# Patient Record
Sex: Male | Born: 1950 | Race: White | Hispanic: No | Marital: Single | Smoking: Former smoker
Health system: Southern US, Community
[De-identification: ages and names within clinical notes are randomized; demographics above are authoritative.]

## PROBLEM LIST (undated history)

## (undated) DIAGNOSIS — N5089 Other specified disorders of the male genital organs: Secondary | ICD-10-CM

## (undated) DIAGNOSIS — J449 Chronic obstructive pulmonary disease, unspecified: Secondary | ICD-10-CM

## (undated) DIAGNOSIS — K219 Gastro-esophageal reflux disease without esophagitis: Secondary | ICD-10-CM

## (undated) DIAGNOSIS — E785 Hyperlipidemia, unspecified: Secondary | ICD-10-CM

## (undated) DIAGNOSIS — I10 Essential (primary) hypertension: Secondary | ICD-10-CM

## (undated) HISTORY — PX: APPENDECTOMY: SHX54

---

## 2012-10-03 DIAGNOSIS — M6208 Separation of muscle (nontraumatic), other site: Secondary | ICD-10-CM

## 2012-10-03 HISTORY — DX: Separation of muscle (nontraumatic), other site: M62.08

## 2013-05-09 DIAGNOSIS — M6208 Separation of muscle (nontraumatic), other site: Secondary | ICD-10-CM | POA: Insufficient documentation

## 2014-10-03 HISTORY — PX: HERNIA REPAIR: SHX51

## 2016-02-03 DIAGNOSIS — J449 Chronic obstructive pulmonary disease, unspecified: Secondary | ICD-10-CM | POA: Insufficient documentation

## 2016-02-03 DIAGNOSIS — R0602 Shortness of breath: Secondary | ICD-10-CM | POA: Insufficient documentation

## 2016-02-03 DIAGNOSIS — E785 Hyperlipidemia, unspecified: Secondary | ICD-10-CM | POA: Insufficient documentation

## 2016-02-03 DIAGNOSIS — Z8249 Family history of ischemic heart disease and other diseases of the circulatory system: Secondary | ICD-10-CM | POA: Insufficient documentation

## 2016-02-03 DIAGNOSIS — I10 Essential (primary) hypertension: Secondary | ICD-10-CM | POA: Insufficient documentation

## 2016-10-03 DIAGNOSIS — N400 Enlarged prostate without lower urinary tract symptoms: Secondary | ICD-10-CM

## 2016-10-03 HISTORY — DX: Benign prostatic hyperplasia without lower urinary tract symptoms: N40.0

## 2017-08-14 DIAGNOSIS — K219 Gastro-esophageal reflux disease without esophagitis: Secondary | ICD-10-CM | POA: Insufficient documentation

## 2017-08-14 DIAGNOSIS — N401 Enlarged prostate with lower urinary tract symptoms: Secondary | ICD-10-CM | POA: Insufficient documentation

## 2017-09-22 DIAGNOSIS — N5089 Other specified disorders of the male genital organs: Secondary | ICD-10-CM | POA: Insufficient documentation

## 2017-12-06 DIAGNOSIS — K2920 Alcoholic gastritis without bleeding: Secondary | ICD-10-CM | POA: Insufficient documentation

## 2018-05-15 DIAGNOSIS — B351 Tinea unguium: Secondary | ICD-10-CM | POA: Insufficient documentation

## 2018-08-10 ENCOUNTER — Ambulatory Visit
Admission: RE | Admit: 2018-08-10 | Discharge: 2018-08-10 | Disposition: A | Discharge status: Home / Self Care | Payer: Medicare Other | Source: Ambulatory Visit | Attending: Internal Medicine | Admitting: Internal Medicine

## 2018-08-10 ENCOUNTER — Other Ambulatory Visit: Payer: Self-pay | Admitting: Internal Medicine

## 2018-08-10 ENCOUNTER — Encounter (INDEPENDENT_AMBULATORY_CARE_PROVIDER_SITE_OTHER): Payer: Self-pay

## 2018-08-10 DIAGNOSIS — M25475 Effusion, left foot: Secondary | ICD-10-CM | POA: Diagnosis present

## 2019-10-14 ENCOUNTER — Other Ambulatory Visit: Payer: Self-pay

## 2019-10-14 ENCOUNTER — Encounter: Payer: Self-pay | Admitting: Podiatry

## 2019-10-14 ENCOUNTER — Other Ambulatory Visit: Payer: Self-pay | Admitting: Podiatry

## 2019-10-14 ENCOUNTER — Ambulatory Visit (INDEPENDENT_AMBULATORY_CARE_PROVIDER_SITE_OTHER): Payer: Medicare Other | Admitting: Podiatry

## 2019-10-14 ENCOUNTER — Ambulatory Visit (INDEPENDENT_AMBULATORY_CARE_PROVIDER_SITE_OTHER): Payer: Medicare Other

## 2019-10-14 DIAGNOSIS — G5762 Lesion of plantar nerve, left lower limb: Secondary | ICD-10-CM

## 2019-10-14 DIAGNOSIS — G5782 Other specified mononeuropathies of left lower limb: Secondary | ICD-10-CM

## 2019-10-14 DIAGNOSIS — M722 Plantar fascial fibromatosis: Secondary | ICD-10-CM

## 2019-10-14 DIAGNOSIS — M79672 Pain in left foot: Secondary | ICD-10-CM | POA: Diagnosis not present

## 2019-10-14 NOTE — Progress Notes (Signed)
Subjective:  Patient ID: Lucas Schaefer, male    DOB: [birth year],  MRN: [id number]  Chief Complaint  Patient presents with  . Foot Pain    Patient presents today for painful knot and toes spreading x years, progressivly getting worse.  He states  "it feels like Im walking on a know under my 3rd toe and it hurts"    69 y.o. male presents with the above complaint.  Patient states that he has pain to the left submetatarsal 3.  He states it hurts when ambulating.  He states that there is a knot underneath the third and fourth digit.  He denies any other acute complaints.  He ambulates with regular sneakers.  He states this has been going on for couple of years.  It is progressively getting worse.  He states that he was getting cortisone injection by a previous doctor in [facility].  He denies any other acute complaints at this time   Review of Systems: Negative except as noted in the HPI. Denies N/V/F/Ch.  No past medical history on file.  Current Outpatient Medications:  .  budesonide-formoterol (SYMBICORT) 160-4.5 MCG/ACT inhaler, Inhale into the lungs., Disp: , Rfl:  .  nicotine (NICOTROL) 10 MG inhaler, Inhale into the lungs., Disp: , Rfl:  .  Buprenorphine HCl-Naloxone HCl 8-2 MG FILM, Place under the tongue., Disp: , Rfl:  .  CHANTIX CONTINUING MONTH PAK 1 MG tablet, Take 1 mg by mouth 2 (two) times daily., Disp: , Rfl:  .  lisinopril-hydrochlorothiazide (ZESTORETIC) 20-12.5 MG tablet, Take 1 tablet by mouth daily., Disp: , Rfl:  .  omeprazole (PRILOSEC) 40 MG capsule, Take 40 mg by mouth daily., Disp: , Rfl:  .  pravastatin (PRAVACHOL) 40 MG tablet, Take 40 mg by mouth daily., Disp: , Rfl:   Social History   Tobacco Use  Smoking Status Current Some Day Smoker  . Types: Cigars  Smokeless Tobacco Never Used    No Known Allergies Objective:  There were no vitals filed for this visit. There is no height or weight on file to calculate BMI. Constitutional Well  developed. Well nourished.  Vascular Dorsalis pedis pulses palpable bilaterally. Posterior tibial pulses palpable bilaterally. Capillary refill normal to all digits.  No cyanosis or clubbing noted. Pedal hair growth normal.  Neurologic Normal speech. Oriented to person, place, and time. Epicritic sensation to light touch grossly present bilaterally.  Dermatologic Nails well groomed and normal in appearance. No open wounds. No skin lesions.  Orthopedic:  Pain on palpation to the third interspace of the left foot.  Positive Mulder's click positive Tinel's sign.  No pain with range of motion of the second and third metatarsophalangeal joint.  No intra-articular pain of the third and fourth metatarsophalangeal joint.  Positive Conley Canal sign of the third and fourth digit   Radiographs: 3 views of skeletally mature adult foot left: Positive Conley Canal sign noted of the third and fourth digit.  No other bony abnormalities noted.  No arthritic changes noted. Assessment:   1. 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 Left foot pain    Plan:  Patient was evaluated and treated and all questions answered.  Left third interspace neuroma/Morton's neuroma -I explained to the patient the etiology of neuroma as well as various treatment options associated with it.  I explained to the patient that there is 2 ways to treat a neuroma either with 4% alcohol sclerosing injection versus surgical excision of the neuroma.  Patient elected  to undergo 4% alcohol sclerosing injection at this time.  He states that if the injection does not work then we will consider surgical excision at that time.  I explained to the patient that this will take about 6 to 7 injections approximately 3 weeks apart.  Patient states understanding and would like to proceed with the injection -After obtaining consent, and per orders of Dr. Boneta Lucks, injection of 4% dehydrated alcohol injections in the third interspace left foot given by  Felipa Furnace. Patient instructed to remain in clinic for 20 minutes afterwards, and to report any adverse reaction to me immediately.   No follow-ups on file.

## 2019-10-17 IMAGING — CR DG FOOT 2V*L*
2 series · 2 of 2 positions shown · non-contrast
Comparison: None.

CLINICAL DATA: 66 y/o  M; swelling of the foot joint.

EXAM:
LEFT FOOT - 2 VIEW

[foot ap]
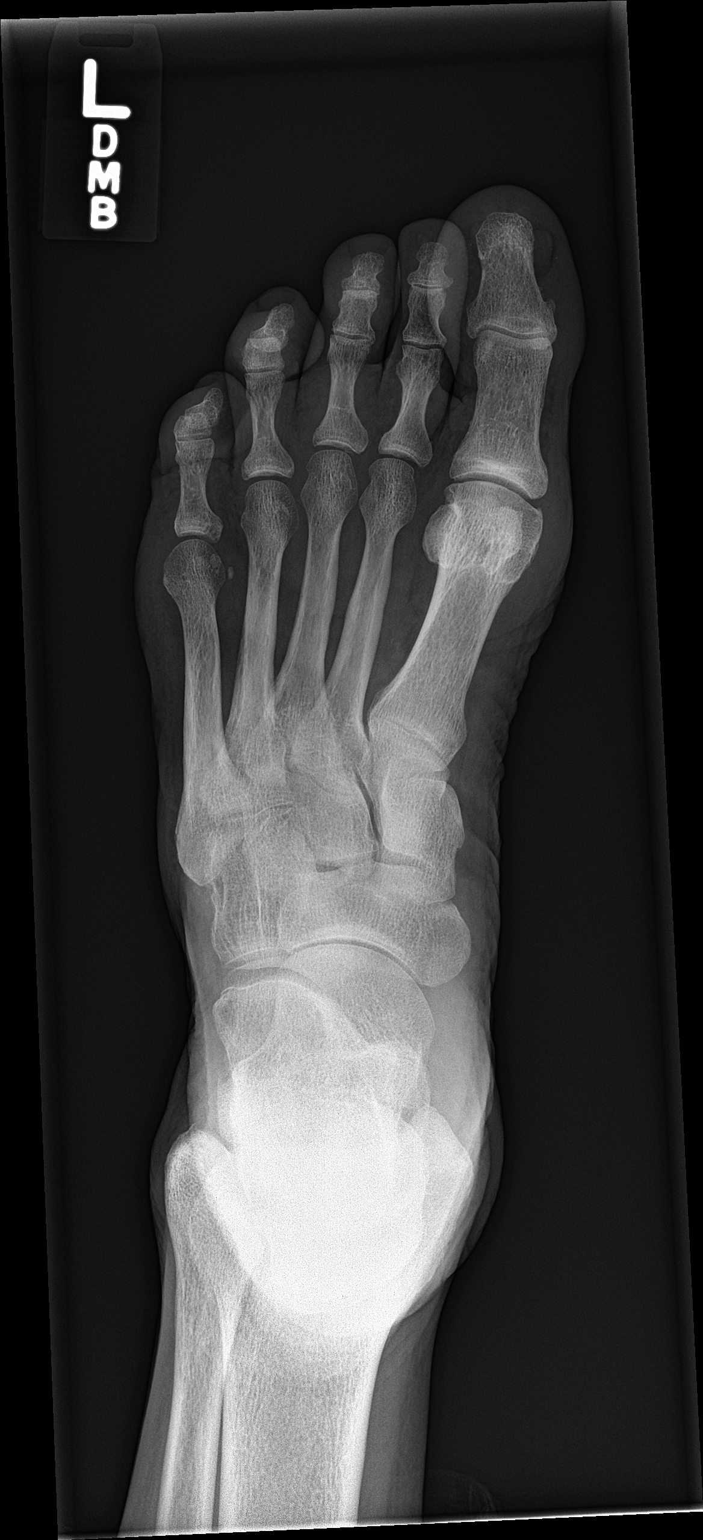

[foot lat]
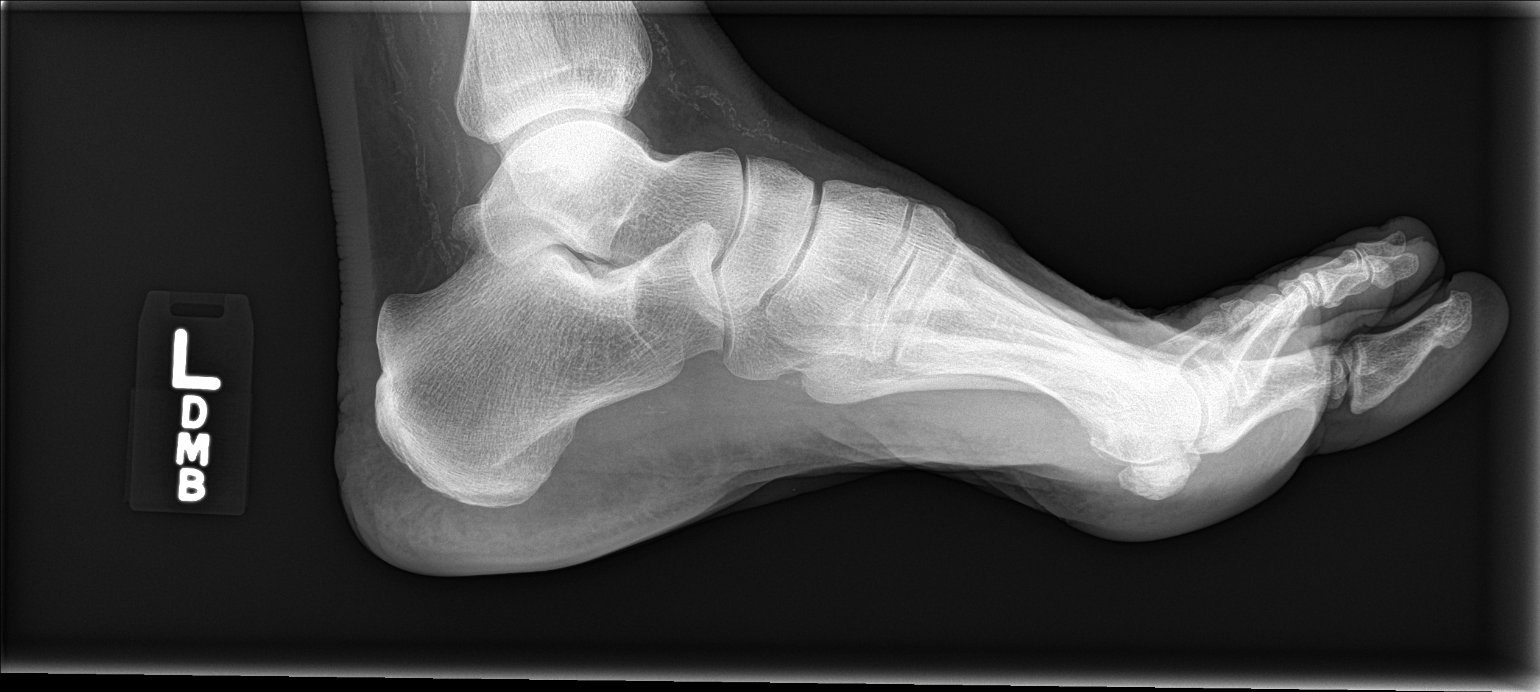

[2 of 2 positions shown; findings below may reference images not displayed]

FINDINGS: There is no evidence of fracture or dislocation. There is no
evidence of arthropathy or other focal bone abnormality. Vascular
calcifications.
IMPRESSION: Negative.

## 2019-11-05 ENCOUNTER — Other Ambulatory Visit: Payer: Self-pay

## 2019-11-05 ENCOUNTER — Ambulatory Visit (INDEPENDENT_AMBULATORY_CARE_PROVIDER_SITE_OTHER): Payer: Medicare Other | Admitting: Podiatry

## 2019-11-05 ENCOUNTER — Encounter: Payer: Self-pay | Admitting: Podiatry

## 2019-11-05 DIAGNOSIS — M79672 Pain in left foot: Secondary | ICD-10-CM | POA: Diagnosis not present

## 2019-11-05 DIAGNOSIS — G5762 Lesion of plantar nerve, left lower limb: Secondary | ICD-10-CM | POA: Diagnosis not present

## 2019-11-05 DIAGNOSIS — G5782 Other specified mononeuropathies of left lower limb: Secondary | ICD-10-CM

## 2019-11-05 NOTE — Progress Notes (Signed)
Subjective:  Patient ID: Lucas Schaefer, male    DOB: Feb 11, 1951,  MRN: [id number]  Chief Complaint  Patient presents with  . Foot Pain    pt is here for a f/u of neuroma of the left foot, pt states that he is feeling alot better since the last time he was here, pt also states that the left foot does have pain from time to time.    69 y.o. male presents with the above complaint.  Patient is here for follow-up of left foot neuroma third interspace.  Patient states the injection that was given last time is already helping a lot.  He states is doing much better.  He is here for his multiple series of neuroma injection with sclerosing alcohol.  His pain is 5 out of 10.  He denies any other acute complaints.  Today will be his second injection   Review of Systems: Negative except as noted in the HPI. Denies N/V/F/Ch.  No past medical history on file.  Current Outpatient Medications:  .  budesonide-formoterol (SYMBICORT) 160-4.5 MCG/ACT inhaler, Inhale into the lungs., Disp: , Rfl:  .  Buprenorphine HCl-Naloxone HCl 8-2 MG FILM, Place under the tongue., Disp: , Rfl:  .  CHANTIX CONTINUING MONTH PAK 1 MG tablet, Take 1 mg by mouth 2 (two) times daily., Disp: , Rfl:  .  lisinopril-hydrochlorothiazide (ZESTORETIC) 20-12.5 MG tablet, Take 1 tablet by mouth daily., Disp: , Rfl:  .  nicotine (NICOTROL) 10 MG inhaler, Inhale into the lungs., Disp: , Rfl:  .  omeprazole (PRILOSEC) 40 MG capsule, Take 40 mg by mouth daily., Disp: , Rfl:  .  pravastatin (PRAVACHOL) 40 MG tablet, Take 40 mg by mouth daily., Disp: , Rfl:  .  PREVIDENT 5000 SENSITIVE 1.1-5 % PSTE, Place 1 application onto teeth 2 (two) times daily., Disp: , Rfl:   Social History   Tobacco Use  Smoking Status Current Some Day Smoker  . Types: Cigars  Smokeless Tobacco Never Used    No Known Allergies Objective:  There were no vitals filed for this visit. There is no height or weight on file to calculate BMI.  Constitutional Well developed. Well nourished.  Vascular Dorsalis pedis pulses palpable bilaterally. Posterior tibial pulses palpable bilaterally. Capillary refill normal to all digits.  No cyanosis or clubbing noted. Pedal hair growth normal.  Neurologic Normal speech. Oriented to person, place, and time. Epicritic sensation to light touch grossly present bilaterally.  Dermatologic Nails well groomed and normal in appearance. No open wounds. No skin lesions.  Orthopedic:  Pain on palpation to the third interspace of the left foot.  Positive Mulder's click positive Tinel's sign.  No pain with range of motion of the second and third metatarsophalangeal joint.  No intra-articular pain of the third and fourth metatarsophalangeal joint.  Positive Conley Canal sign of the third and fourth digit   Radiographs: None Assessment:   1. Left foot pain   2. 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  Left third interspace neuroma/Morton's neuroma -Patient is here for his multiple series of sclerosing 4% alcohol injection to the left third interspace for treatment of Morton's neuroma.  Today will be his second injection as described below. -After obtaining consent, and per orders of Dr. Boneta Lucks, injection of 4% dehydrated alcohol injections in the third interspace left foot given by Felipa Furnace. Patient instructed to remain in clinic for 20 minutes  afterwards, and to report any adverse reaction to me immediately.   No follow-ups on file.

## 2019-11-26 ENCOUNTER — Ambulatory Visit: Payer: Medicare Other | Admitting: Podiatry

## 2019-12-03 ENCOUNTER — Other Ambulatory Visit: Payer: Self-pay

## 2019-12-03 ENCOUNTER — Ambulatory Visit (INDEPENDENT_AMBULATORY_CARE_PROVIDER_SITE_OTHER): Payer: Medicare Other | Admitting: Podiatry

## 2019-12-03 DIAGNOSIS — M79672 Pain in left foot: Secondary | ICD-10-CM

## 2019-12-03 DIAGNOSIS — G5762 Lesion of plantar nerve, left lower limb: Secondary | ICD-10-CM

## 2019-12-03 DIAGNOSIS — G5782 Other specified mononeuropathies of left lower limb: Secondary | ICD-10-CM

## 2019-12-04 ENCOUNTER — Encounter: Payer: Self-pay | Admitting: Podiatry

## 2019-12-04 NOTE — Progress Notes (Signed)
Subjective:  Patient ID: Lucas Schaefer, male    DOB: 1951/08/25,  MRN: [id number]  Chief Complaint  Patient presents with  . Neuroma    pt is here for a possible 3rd injection of neuroma, pt is feeling a lot better since the last time he was here, pt states that his pain has gone down to a 5 out of 10 on the pain scale.    69 y.o. male presents with the above complaint.  Patient is here for follow-up of left foot neuroma third interspace.  Patient states the injection that was given last time is already helping a lot.  He states is doing much better.  He is here for his multiple series of neuroma injection with sclerosing alcohol.  His pain is 4 out of 10.  He denies any other acute complaints.  Today will be third injection.   Review of Systems: Negative except as noted in the HPI. Denies N/V/F/Ch.  No past medical history on file.  Current Outpatient Medications:  .  albuterol (VENTOLIN HFA) 108 (90 Base) MCG/ACT inhaler, , Disp: , Rfl:  .  amLODipine (NORVASC) 10 MG tablet, Take by mouth., Disp: , Rfl:  .  budesonide-formoterol (SYMBICORT) 160-4.5 MCG/ACT inhaler, Inhale into the lungs., Disp: , Rfl:  .  Buprenorphine HCl-Naloxone HCl 8-2 MG FILM, Place under the tongue., Disp: , Rfl:  .  CHANTIX CONTINUING MONTH PAK 1 MG tablet, Take 1 mg by mouth 2 (two) times daily., Disp: , Rfl:  .  lisinopril-hydrochlorothiazide (ZESTORETIC) 20-12.5 MG tablet, Take 1 tablet by mouth daily., Disp: , Rfl:  .  nicotine (NICOTROL) 10 MG inhaler, Inhale into the lungs., Disp: , Rfl:  .  omeprazole (PRILOSEC) 40 MG capsule, Take 40 mg by mouth daily., Disp: , Rfl:  .  pravastatin (PRAVACHOL) 40 MG tablet, Take 40 mg by mouth daily., Disp: , Rfl:  .  PREVIDENT 5000 SENSITIVE 1.1-5 % PSTE, Place 1 application onto teeth 2 (two) times daily., Disp: , Rfl:  .  tamsulosin (FLOMAX) 0.4 MG CAPS capsule, Take by mouth., Disp: , Rfl:   Social History   Tobacco Use  Smoking Status Current Some Day  Smoker  . Types: Cigars  Smokeless Tobacco Never Used    No Known Allergies Objective:  There were no vitals filed for this visit. There is no height or weight on file to calculate BMI. Constitutional Well developed. Well nourished.  Vascular Dorsalis pedis pulses palpable bilaterally. Posterior tibial pulses palpable bilaterally. Capillary refill normal to all digits.  No cyanosis or clubbing noted. Pedal hair growth normal.  Neurologic Normal speech. Oriented to person, place, and time. Epicritic sensation to light touch grossly present bilaterally.  Dermatologic Nails well groomed and normal in appearance. No open wounds. No skin lesions.  Orthopedic:  Mild pain on palpation to the third interspace of the left foot.  Positive Mulder's click positive Tinel's sign.  No pain with range of motion of the second and third metatarsophalangeal joint.  No intra-articular pain of the third and fourth metatarsophalangeal joint.  Positive Conley Canal sign of the third and fourth digit   Radiographs: None Assessment:   1. Left foot pain   2. 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  Left third interspace neuroma/Morton's neuroma -Patient is here for his multiple series of sclerosing 4% alcohol injection to the left third interspace for treatment of Morton's neuroma.  Today will  be his third injection as described below. -After obtaining consent, and per orders of Dr. Boneta Lucks, injection of 4% dehydrated alcohol injections in the third interspace left foot given by Felipa Furnace. Patient instructed to remain in clinic for 20 minutes afterwards, and to report any adverse reaction to me immediately.   No follow-ups on file.

## 2019-12-24 ENCOUNTER — Ambulatory Visit (INDEPENDENT_AMBULATORY_CARE_PROVIDER_SITE_OTHER): Payer: Medicare Other | Admitting: Podiatry

## 2019-12-24 ENCOUNTER — Encounter: Payer: Self-pay | Admitting: Podiatry

## 2019-12-24 ENCOUNTER — Other Ambulatory Visit: Payer: Self-pay

## 2019-12-24 DIAGNOSIS — G5782 Other specified mononeuropathies of left lower limb: Secondary | ICD-10-CM

## 2019-12-24 DIAGNOSIS — T148XXA Other injury of unspecified body region, initial encounter: Secondary | ICD-10-CM | POA: Diagnosis not present

## 2019-12-24 DIAGNOSIS — G5762 Lesion of plantar nerve, left lower limb: Secondary | ICD-10-CM | POA: Diagnosis not present

## 2019-12-24 DIAGNOSIS — M79672 Pain in left foot: Secondary | ICD-10-CM

## 2019-12-24 NOTE — Progress Notes (Signed)
Subjective:  Patient ID: Lucas Schaefer, male    DOB: 18-Feb-1951,  MRN: [id number]  Chief Complaint  Patient presents with  . Neuroma    pt is here for a possible neuroma injection, pt also states that the left foot pain will have a popping sensation at times, pt puts pain as a 5 out of 16    69 y.o. male presents with the above complaint.  Patient is here for follow-up of left foot neuroma third interspace.  Patient states the injection that was given last time is already helping a lot.  He states is doing much better.  He is here for his multiple series of neuroma injection with sclerosing alcohol.  His pain is 4 out of 10.  He denies any other acute complaints.  Today will be fourth injection.  Patient also has a secondary complaint of left leg soft tissue contusion.  Patient states he tripped and fell and bruised the left leg.  It appears that patient has abrasion with some swelling associated with it.  There is mild ecchymosis present.  But he has been able to ambulate and does not have any pain.  He denies any other acute complaints.   Review of Systems: Negative except as noted in the HPI. Denies N/V/F/Ch.  No past medical history on file.  Current Outpatient Medications:  .  albuterol (VENTOLIN HFA) 108 (90 Base) MCG/ACT inhaler, , Disp: , Rfl:  .  amLODipine (NORVASC) 10 MG tablet, Take by mouth., Disp: , Rfl:  .  budesonide-formoterol (SYMBICORT) 160-4.5 MCG/ACT inhaler, Inhale into the lungs., Disp: , Rfl:  .  Buprenorphine HCl-Naloxone HCl 8-2 MG FILM, Place under the tongue., Disp: , Rfl:  .  CHANTIX CONTINUING MONTH PAK 1 MG tablet, Take 1 mg by mouth 2 (two) times daily., Disp: , Rfl:  .  lisinopril-hydrochlorothiazide (ZESTORETIC) 20-12.5 MG tablet, Take 1 tablet by mouth daily., Disp: , Rfl:  .  nicotine (NICOTROL) 10 MG inhaler, Inhale into the lungs., Disp: , Rfl:  .  omeprazole (PRILOSEC) 40 MG capsule, Take 40 mg by mouth daily., Disp: , Rfl:  .  pravastatin  (PRAVACHOL) 40 MG tablet, Take 40 mg by mouth daily., Disp: , Rfl:  .  PREVIDENT 5000 SENSITIVE 1.1-5 % PSTE, Place 1 application onto teeth 2 (two) times daily., Disp: , Rfl:  .  tamsulosin (FLOMAX) 0.4 MG CAPS capsule, Take by mouth., Disp: , Rfl:   Social History   Tobacco Use  Smoking Status Current Some Day Smoker  . Types: Cigars  Smokeless Tobacco Never Used    No Known Allergies Objective:  There were no vitals filed for this visit. There is no height or weight on file to calculate BMI. Constitutional Well developed. Well nourished.  Vascular Dorsalis pedis pulses palpable bilaterally. Posterior tibial pulses palpable bilaterally. Capillary refill normal to all digits.  No cyanosis or clubbing noted. Pedal hair growth normal.  Neurologic Normal speech. Oriented to person, place, and time. Epicritic sensation to light touch grossly present bilaterally.  Dermatologic Nails well groomed and normal in appearance. No open wounds. No skin lesions.  Orthopedic:  Mild pain on palpation to the third interspace of the left foot.  Positive Mulder's click positive Tinel's sign.  No pain with range of motion of the second and third metatarsophalangeal joint.  No intra-articular pain of the third and fourth metatarsophalangeal joint.  Positive Conley Canal sign of the third and fourth digit  Left leg contusion with swelling and mild abrasion.  No pain on palpation.  No concern for bony involvement/fractures   Radiographs: None Assessment:   1. Contusion of soft tissue   2. 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 Left foot pain    Plan:  Patient was evaluated and treated and all questions answered.  Left third interspace neuroma/Morton's neuroma -Patient is here for his multiple series of sclerosing 4% alcohol injection to the left third interspace for treatment of Morton's neuroma.  Today will be his fourth injection as described below. -After obtaining consent, and per  orders of Dr. Boneta Lucks, injection of 4% dehydrated alcohol injections in the third interspace left foot given by Felipa Furnace. Patient instructed to remain in clinic for 20 minutes afterwards, and to report any adverse reaction to me immediately.  Left leg soft tissue contusion with superficial abrasion -I explained to the patient the etiology of soft tissue contusion and various treatment options were discussed.  After his acute fall he may have been because some muscle bruising leading to mild ecchymosis and swelling associated with it.  Given that this is only 39 days old.  I have asked the patient to aggressively elevate.  Given that he does not have any pain I am less concerned about osseous involvement. -Triple Antibiotic and a Band-Aid was applied.   No follow-ups on file.

## 2020-01-14 ENCOUNTER — Ambulatory Visit: Payer: Medicare Other | Admitting: Podiatry

## 2020-01-28 ENCOUNTER — Encounter: Payer: Self-pay | Admitting: Podiatry

## 2020-01-28 ENCOUNTER — Ambulatory Visit (INDEPENDENT_AMBULATORY_CARE_PROVIDER_SITE_OTHER): Payer: Medicare Other | Admitting: Podiatry

## 2020-01-28 ENCOUNTER — Other Ambulatory Visit: Payer: Self-pay

## 2020-01-28 DIAGNOSIS — G5782 Other specified mononeuropathies of left lower limb: Secondary | ICD-10-CM

## 2020-01-28 DIAGNOSIS — M79672 Pain in left foot: Secondary | ICD-10-CM

## 2020-01-28 DIAGNOSIS — G5762 Lesion of plantar nerve, left lower limb: Secondary | ICD-10-CM

## 2020-01-28 NOTE — Progress Notes (Signed)
Subjective:  Patient ID: Lucas Schaefer, male    DOB: 1951-01-05,  MRN: [id number]  Chief Complaint  Patient presents with  . Foot Pain    pt is here for a f/u for neuroma of the left foot, pt states that he is feeling better, but states that he still has numbness in toes 1-5    69 y.o. male presents with the above complaint.  Patient is here following up for the left foot neuroma third interspace injection.  Patient is today here for his fifth injection with alcohol 4% sclerosing.  Patient states his pain is improved considerably.  Overall he is doing really well.  His left leg contusion has also helped healed completely.  He denies any other acute complaints.   Review of Systems: Negative except as noted in the HPI. Denies N/V/F/Ch.  No past medical history on file.  Current Outpatient Medications:  .  albuterol (VENTOLIN HFA) 108 (90 Base) MCG/ACT inhaler, , Disp: , Rfl:  .  amLODipine (NORVASC) 10 MG tablet, Take by mouth., Disp: , Rfl:  .  budesonide-formoterol (SYMBICORT) 160-4.5 MCG/ACT inhaler, Inhale into the lungs., Disp: , Rfl:  .  Buprenorphine HCl-Naloxone HCl 8-2 MG FILM, Place under the tongue., Disp: , Rfl:  .  CHANTIX CONTINUING MONTH PAK 1 MG tablet, Take 1 mg by mouth 2 (two) times daily., Disp: , Rfl:  .  lisinopril-hydrochlorothiazide (ZESTORETIC) 20-12.5 MG tablet, Take 1 tablet by mouth daily., Disp: , Rfl:  .  nicotine (NICOTROL) 10 MG inhaler, Inhale into the lungs., Disp: , Rfl:  .  omeprazole (PRILOSEC) 40 MG capsule, Take 40 mg by mouth daily., Disp: , Rfl:  .  pravastatin (PRAVACHOL) 40 MG tablet, Take 40 mg by mouth daily., Disp: , Rfl:  .  PREVIDENT 5000 SENSITIVE 1.1-5 % PSTE, Place 1 application onto teeth 2 (two) times daily., Disp: , Rfl:  .  tamsulosin (FLOMAX) 0.4 MG CAPS capsule, Take by mouth., Disp: , Rfl:   Social History   Tobacco Use  Smoking Status Current Some Day Smoker  . Types: Cigars  Smokeless Tobacco Never Used    No  Known Allergies Objective:  There were no vitals filed for this visit. There is no height or weight on file to calculate BMI. Constitutional Well developed. Well nourished.  Vascular Dorsalis pedis pulses palpable bilaterally. Posterior tibial pulses palpable bilaterally. Capillary refill normal to all digits.  No cyanosis or clubbing noted. Pedal hair growth normal.  Neurologic Normal speech. Oriented to person, place, and time. Epicritic sensation to light touch grossly present bilaterally.  Dermatologic Nails well groomed and normal in appearance. No open wounds. No skin lesions.  Orthopedic:  Mild pain on palpation to the third interspace of the left foot.  Positive Mulder's click positive Tinel's sign.  No pain with range of motion of the second and third metatarsophalangeal joint.  No intra-articular pain of the third and fourth metatarsophalangeal joint.  Positive Conley Canal sign of the third and fourth digit  Left leg contusion with swelling and mild abrasion.  No pain on palpation.  No concern for bony involvement/fractures   Radiographs: None Assessment:   1. 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 Left foot pain    Plan:  Patient was evaluated and treated and all questions answered.  Left third interspace neuroma/Morton's neuroma -Patient is here for his multiple series of sclerosing 4% alcohol injection to the left third interspace for treatment of Morton's neuroma.  Today will be  his fifth injection as described below. -After obtaining consent, and per orders of Dr. Boneta Lucks, injection of 4% dehydrated alcohol injections in the third interspace left foot given by Felipa Furnace. Patient instructed to remain in clinic for 20 minutes afterwards, and to report any adverse reaction to me immediately.  Left leg soft tissue contusion with superficial abrasion -Resolved.  No follow-ups on file.

## 2020-02-04 ENCOUNTER — Ambulatory Visit: Payer: Medicare Other | Admitting: Podiatry

## 2020-02-25 ENCOUNTER — Ambulatory Visit: Payer: Medicare Other | Admitting: Podiatry

## 2020-05-06 ENCOUNTER — Other Ambulatory Visit: Payer: Self-pay

## 2020-05-06 ENCOUNTER — Other Ambulatory Visit
Admission: RE | Admit: 2020-05-06 | Discharge: 2020-05-06 | Disposition: A | Discharge status: Home / Self Care | Payer: Medicare Other | Source: Ambulatory Visit | Attending: Gastroenterology | Admitting: Gastroenterology

## 2020-05-06 DIAGNOSIS — Z20822 Contact with and (suspected) exposure to covid-19: Secondary | ICD-10-CM | POA: Insufficient documentation

## 2020-05-06 DIAGNOSIS — Z01812 Encounter for preprocedural laboratory examination: Secondary | ICD-10-CM | POA: Insufficient documentation

## 2020-05-06 LAB — SARS CORONAVIRUS 2 (TAT 6-24 HRS): SARS Coronavirus 2: NEGATIVE

## 2020-05-07 ENCOUNTER — Encounter: Payer: Self-pay | Admitting: Internal Medicine

## 2020-05-08 ENCOUNTER — Encounter: Payer: Self-pay | Admitting: Internal Medicine

## 2020-05-08 ENCOUNTER — Other Ambulatory Visit: Payer: Self-pay

## 2020-05-08 ENCOUNTER — Ambulatory Visit
Admission: RE | Admit: 2020-05-08 | Discharge: 2020-05-08 | Disposition: A | Discharge status: Home / Self Care | Payer: Medicare Other | Attending: Gastroenterology | Admitting: Gastroenterology

## 2020-05-08 ENCOUNTER — Encounter
Admission: RE | Disposition: A | Discharge status: Home / Self Care | Payer: Self-pay | Source: Home / Self Care | Attending: Gastroenterology

## 2020-05-08 ENCOUNTER — Ambulatory Visit: Payer: Medicare Other | Admitting: Anesthesiology

## 2020-05-08 DIAGNOSIS — Z79899 Other long term (current) drug therapy: Secondary | ICD-10-CM | POA: Insufficient documentation

## 2020-05-08 DIAGNOSIS — I10 Essential (primary) hypertension: Secondary | ICD-10-CM | POA: Insufficient documentation

## 2020-05-08 DIAGNOSIS — Z1211 Encounter for screening for malignant neoplasm of colon: Secondary | ICD-10-CM | POA: Diagnosis not present

## 2020-05-08 DIAGNOSIS — N400 Enlarged prostate without lower urinary tract symptoms: Secondary | ICD-10-CM | POA: Insufficient documentation

## 2020-05-08 DIAGNOSIS — K219 Gastro-esophageal reflux disease without esophagitis: Secondary | ICD-10-CM | POA: Diagnosis present

## 2020-05-08 DIAGNOSIS — J449 Chronic obstructive pulmonary disease, unspecified: Secondary | ICD-10-CM | POA: Diagnosis not present

## 2020-05-08 DIAGNOSIS — E785 Hyperlipidemia, unspecified: Secondary | ICD-10-CM | POA: Insufficient documentation

## 2020-05-08 DIAGNOSIS — Z86010 Personal history of colonic polyps: Secondary | ICD-10-CM | POA: Insufficient documentation

## 2020-05-08 DIAGNOSIS — F17200 Nicotine dependence, unspecified, uncomplicated: Secondary | ICD-10-CM | POA: Insufficient documentation

## 2020-05-08 DIAGNOSIS — K640 First degree hemorrhoids: Secondary | ICD-10-CM | POA: Diagnosis not present

## 2020-05-08 DIAGNOSIS — Z7951 Long term (current) use of inhaled steroids: Secondary | ICD-10-CM | POA: Insufficient documentation

## 2020-05-08 HISTORY — DX: Other specified disorders of the male genital organs: N50.89

## 2020-05-08 HISTORY — PX: ESOPHAGOGASTRODUODENOSCOPY (EGD) WITH PROPOFOL: SHX5813

## 2020-05-08 HISTORY — DX: Essential (primary) hypertension: I10

## 2020-05-08 HISTORY — DX: Chronic obstructive pulmonary disease, unspecified: J44.9

## 2020-05-08 HISTORY — DX: Hyperlipidemia, unspecified: E78.5

## 2020-05-08 HISTORY — PX: COLONOSCOPY WITH PROPOFOL: SHX5780

## 2020-05-08 HISTORY — DX: Gastro-esophageal reflux disease without esophagitis: K21.9

## 2020-05-08 LAB — URINE DRUG SCREEN, QUALITATIVE (ARMC ONLY)
Amphetamines, Ur Screen: NOT DETECTED
Barbiturates, Ur Screen: NOT DETECTED
Benzodiazepine, Ur Scrn: NOT DETECTED
Cannabinoid 50 Ng, Ur ~~LOC~~: NOT DETECTED
Cocaine Metabolite,Ur ~~LOC~~: NOT DETECTED
MDMA (Ecstasy)Ur Screen: NOT DETECTED
Methadone Scn, Ur: NOT DETECTED
Opiate, Ur Screen: NOT DETECTED
Phencyclidine (PCP) Ur S: NOT DETECTED
Tricyclic, Ur Screen: NOT DETECTED

## 2020-05-08 SURGERY — COLONOSCOPY WITH PROPOFOL
Anesthesia: General

## 2020-05-08 MED ORDER — PROPOFOL 500 MG/50ML IV EMUL
INTRAVENOUS | Status: AC
  Filled 2020-05-08: qty 50

## 2020-05-08 MED ORDER — PROPOFOL 10 MG/ML IV BOLUS
INTRAVENOUS | Status: DC | PRN
  Administered 2020-05-08: 100 mg via INTRAVENOUS

## 2020-05-08 MED ORDER — LIDOCAINE HCL (CARDIAC) PF 100 MG/5ML IV SOSY
PREFILLED_SYRINGE | INTRAVENOUS | Status: DC | PRN
  Administered 2020-05-08: 30 mg via INTRAVENOUS

## 2020-05-08 MED ORDER — PROPOFOL 500 MG/50ML IV EMUL
INTRAVENOUS | Status: DC | PRN
  Administered 2020-05-08: 150 ug/kg/min via INTRAVENOUS

## 2020-05-08 MED ORDER — SODIUM CHLORIDE 0.9 % IV SOLN
INTRAVENOUS | Status: DC
  Administered 2020-05-08: 1000 mL via INTRAVENOUS

## 2020-05-08 MED ORDER — PROPOFOL 10 MG/ML IV BOLUS
INTRAVENOUS | Status: AC
  Filled 2020-05-08: qty 20

## 2020-05-08 MED ORDER — LIDOCAINE HCL (PF) 2 % IJ SOLN
INTRAMUSCULAR | Status: AC
  Filled 2020-05-08: qty 5

## 2020-05-08 MED ORDER — LIDOCAINE HCL (PF) 1 % IJ SOLN
INTRAMUSCULAR | Status: AC
  Filled 2020-05-08: qty 2

## 2020-05-08 NOTE — H&P (Signed)
Outpatient short stay form Pre-procedure 05/08/2020 12:46 PM Lucas Miyamoto MD, MPH  Primary Physician: Dr. Vinetta Bergamo   Reason for visit:  [facility] Screening/surveillance colonoscopy  History of present illness:   Lucas Schaefer is a 69 y/o gentleman with past medical history of hypertension and tobacco abuse here for BE's screening given long history of heart burn that's well controlled with PPI. Here for surveillance colonoscopy. Last colonoscopy in 2015 with cecal adenoma removed. No family history of GI malignancies. History of hernia repair. No blood thinners.    Current Facility-Administered Medications:  .  0.9 %  sodium chloride infusion, , Intravenous, Continuous, , Hilton Cork, MD, Last Rate: 20 mL/hr at 05/08/20 1241, 1,000 mL at 05/08/20 1241 .  lidocaine (PF) (XYLOCAINE) 1 % injection, , , ,   Medications Prior to Admission  Medication Sig Dispense Refill Last Dose  . albuterol (PROVENTIL) (2.5 MG/3ML) 0.083% nebulizer solution Take 2.5 mg by nebulization every 6 (six) hours as needed for wheezing or shortness of breath.   Past Week at Unknown time  . albuterol (VENTOLIN HFA) 108 (90 Base) MCG/ACT inhaler 2 puffs every 4 (four) hours as needed for wheezing or shortness of breath.    Past Week at Unknown time  . amLODipine (NORVASC) 10 MG tablet Take by mouth.   05/07/2020 at Unknown time  . budesonide-formoterol (SYMBICORT) 160-4.5 MCG/ACT inhaler Inhale into the lungs.   05/07/2020 at Unknown time  . Buprenorphine HCl-Naloxone HCl 8-2 MG FILM Place 8mg  under the tongue daily   05/07/2020 at Unknown time  . CHANTIX CONTINUING MONTH PAK 1 MG tablet Take 1 mg by mouth 2 (two) times daily.   05/07/2020 at Unknown time  . lisinopril-hydrochlorothiazide (ZESTORETIC) 20-12.5 MG tablet Take 1 tablet by mouth daily.   05/07/2020 at Unknown time  . omeprazole (PRILOSEC) 40 MG capsule Take 40 mg by mouth daily.   05/07/2020 at Unknown time  . pravastatin (PRAVACHOL) 40 MG tablet Take 40 mg by mouth  daily.   05/07/2020 at Unknown time  . pravastatin (PRAVACHOL) 80 MG tablet Take 80 mg by mouth daily.   05/07/2020 at Unknown time  . sildenafil (REVATIO) 20 MG tablet Take 20 mg by mouth daily. Take 5 tablets (100mg  total) once daily as needed. Pulm. hypertension     . nicotine (NICOTROL) 10 MG inhaler Inhale into the lungs.     Marland Kitchen PREVIDENT 5000 SENSITIVE 1.1-5 % PSTE Place 1 application onto teeth 2 (two) times daily.     . tamsulosin (FLOMAX) 0.4 MG CAPS capsule Take by mouth. (Patient not taking: Reported on 05/08/2020)   Not Taking at Unknown time     No Known Allergies   Past Medical History:  Diagnosis Date  . BPH (benign prostatic hyperplasia) 2018  . COPD (chronic obstructive pulmonary disease) ([place])   . Diastasis recti 2014  . Dyslipidemia   . GERD (gastroesophageal reflux disease)   . Hypertension   . Scrotal mass    Left, USS showed 6/16 at [facility], noted also on exam    Review of systems:  Otherwise negative.    Physical Exam  Gen: Alert, oriented. Appears stated age.  HEENT: [place]/AT. PERRLA. Lungs: no respiratory distress Abd: soft, benign, no masses. BS+ Ext: No edema. Pulses 2+    Planned procedures: Proceed with EGD/colonoscopy. The patient understands the nature of the planned procedure, indications, risks, alternatives and potential complications including but not limited to bleeding, infection, perforation, damage to internal organs and possible oversedation/side effects from anesthesia.  The patient agrees and gives consent to proceed.  Please refer to procedure notes for findings, recommendations and patient disposition/instructions.     Lucas Miyamoto MD, MPH Gastroenterology 05/08/2020  12:46 PM

## 2020-05-08 NOTE — Interval H&P Note (Signed)
History and Physical Interval Note:  05/08/2020 12:50 PM  Lucas Schaefer  has presented today for surgery, with the diagnosis of P HX TA POLYPS GERD.  The various methods of treatment have been discussed with the patient and family. After consideration of risks, benefits and other options for treatment, the patient has consented to  Procedure(s): COLONOSCOPY WITH PROPOFOL (N/A) ESOPHAGOGASTRODUODENOSCOPY (EGD) WITH PROPOFOL (N/A) as a surgical intervention.  The patient's history has been reviewed, patient examined, no change in status, stable for surgery.  I have reviewed the patient's chart and labs.  Questions were answered to the patient's satisfaction.     Lesly Rubenstein  Ok to proceed with EGD/Colonoscopy

## 2020-05-08 NOTE — Transfer of Care (Signed)
Immediate Anesthesia Transfer of Care Note  Patient: Lucas Schaefer  Procedure(s) Performed: COLONOSCOPY WITH PROPOFOL (N/A ) ESOPHAGOGASTRODUODENOSCOPY (EGD) WITH PROPOFOL (N/A )  Patient Location: PACU  Anesthesia Type:MAC  Level of Consciousness: sedated  Airway & Oxygen Therapy: Patient Spontanous Breathing and Patient connected to nasal cannula oxygen  Post-op Assessment: Report given to RN and Post -op Vital signs reviewed and stable  Post vital signs: Reviewed and stable  Last Vitals:  Vitals Value Taken Time  BP 117/81 05/08/20 1334  Temp    Pulse 71 05/08/20 1334  Resp 12 05/08/20 1334  SpO2 99 % 05/08/20 1334    Last Pain:  Vitals:   05/08/20 1330  TempSrc: (P) Temporal  PainSc:          Complications: No complications documented.

## 2020-05-08 NOTE — Op Note (Addendum)
[facility] Patient Name: Lucas Schaefer Procedure Date: 05/08/2020 12:03 PM MRN: [id number] Account #: [id number] Date of Birth: 07-Apr-1951 Admit Type: Outpatient Age: 69 Room: [facility] ENDO ROOM 3 Gender: Male Note Status: Supervisor Override Procedure:             Upper GI endoscopy Indications:           Gastro-esophageal reflux disease Providers:             Andrey Farmer MD, MD Medicines:             Monitored Anesthesia Care Complications:         No immediate complications. Procedure:             Pre-Anesthesia Assessment:                        - Prior to the procedure, a History and Physical was                         performed, and patient medications and allergies were                         reviewed. The patient is competent. The risks and                         benefits of the procedure and the sedation options and                         risks were discussed with the patient. All questions                         were answered and informed consent was obtained.                         Patient identification and proposed procedure were                         verified by the physician, the nurse, the anesthetist                         and the technician in the endoscopy suite. Mental                         Status Examination: alert and oriented. Airway                         Examination: normal oropharyngeal airway and neck                         mobility. Respiratory Examination: clear to                         auscultation. CV Examination: normal. Prophylactic                         Antibiotics: The patient does not require prophylactic                         antibiotics. Prior Anticoagulants: The patient has  taken no previous anticoagulant or antiplatelet                         agents. ASA Grade Assessment: II - A patient with mild                         systemic disease. After reviewing the risks  and                         benefits, the patient was deemed in satisfactory                         condition to undergo the procedure. The anesthesia                         plan was to use monitored anesthesia care (MAC).                         Immediately prior to administration of medications,                         the patient was re-assessed for adequacy to receive                         sedatives. The heart rate, respiratory rate, oxygen                         saturations, blood pressure, adequacy of pulmonary                         ventilation, and response to care were monitored                         throughout the procedure. The physical status of the                         patient was re-assessed after the procedure.                        After obtaining informed consent, the endoscope was                         passed under direct vision. Throughout the procedure,                         the patient's blood pressure, pulse, and oxygen                         saturations were monitored continuously. The Endoscope                         was introduced through the mouth, and advanced to the                         second part of duodenum. The upper GI endoscopy was                         accomplished without difficulty. The patient tolerated  the procedure well. Findings:      The examined esophagus was normal.      The entire examined stomach was normal.      The examined duodenum was normal. Impression:            - Normal esophagus.                        - Normal stomach.                        - Normal examined duodenum.                        - No specimens collected. Recommendation:        - Discharge patient to home.                        - Resume previous diet.                        - Continue present medications.                        - Perform a colonoscopy today. Procedure Code(s):     --- Professional ---                         [phone], Esophagogastroduodenoscopy, flexible,                         transoral; diagnostic, including collection of                         specimen(s) by brushing or washing, when performed                         (separate procedure) Diagnosis Code(s):     --- Professional ---                        K21.9, Gastro-esophageal reflux disease without                         esophagitis CPT copyright 2019 American Medical Association. All rights reserved. The codes documented in this report are preliminary and upon coder review may  be revised to meet current compliance requirements. Andrey Farmer, MD Andrey Farmer MD, MD 05/08/2020 1:31:13 PM Number of Addenda: 0 Note Initiated On: 05/08/2020 12:03 PM Estimated Blood Loss:  Estimated blood loss: none.      [facility]

## 2020-05-08 NOTE — Anesthesia Postprocedure Evaluation (Signed)
Anesthesia Post Note  Patient: Damen Windsor Vanaken  Procedure(s) Performed: COLONOSCOPY WITH PROPOFOL (N/A ) ESOPHAGOGASTRODUODENOSCOPY (EGD) WITH PROPOFOL (N/A )  Patient location during evaluation: Endoscopy Anesthesia Type: General Level of consciousness: awake and alert Pain management: pain level controlled Vital Signs Assessment: post-procedure vital signs reviewed and stable Respiratory status: spontaneous breathing, nonlabored ventilation, respiratory function stable and patient connected to nasal cannula oxygen Cardiovascular status: blood pressure returned to baseline and stable Postop Assessment: no apparent nausea or vomiting Anesthetic complications: no   No complications documented.   Last Vitals:  Vitals:   05/08/20 1352 05/08/20 1356  BP:  131/85  Pulse: 70 73  Resp: 20 16  Temp:    SpO2: 100% 96%    Last Pain:  Vitals:   05/08/20 1330  TempSrc: Temporal  PainSc:                  Martha Clan

## 2020-05-08 NOTE — Anesthesia Procedure Notes (Signed)
Procedure Name: MAC Date/Time: 05/08/2020 1:53 PM Performed by: Genevie Ann, CRNA Oxygen Delivery Method: Nasal cannula

## 2020-05-08 NOTE — Op Note (Signed)
[facility] Gastroenterology Patient Name: Lucas Schaefer Procedure Date: 05/08/2020 12:03 PM MRN: [id number] Account #: [id number] Date of Birth: 18-Jun-1951 Admit Type: Outpatient Age: 69 Room: [facility] ENDO ROOM 3 Gender: Male Note Status: Finalized Procedure:             Colonoscopy Indications:           High risk colon cancer surveillance: Personal history                         of colonic polyps Providers:             Andrey Farmer MD, MD Medicines:             Monitored Anesthesia Care Complications:         No immediate complications. Procedure:             Pre-Anesthesia Assessment:                        - Prior to the procedure, a History and Physical was                         performed, and patient medications and allergies were                         reviewed. The patient is competent. The risks and                         benefits of the procedure and the sedation options and                         risks were discussed with the patient. All questions                         were answered and informed consent was obtained.                         Patient identification and proposed procedure were                         verified by the physician, the nurse, the anesthetist                         and the technician in the endoscopy suite. Mental                         Status Examination: alert and oriented. Airway                         Examination: normal oropharyngeal airway and neck                         mobility. Respiratory Examination: clear to                         auscultation. CV Examination: normal. Prophylactic                         Antibiotics: The patient does not require prophylactic  antibiotics. Prior Anticoagulants: The patient has                         taken no previous anticoagulant or antiplatelet                         agents. ASA Grade Assessment: II - A patient with mild                          systemic disease. After reviewing the risks and                         benefits, the patient was deemed in satisfactory                         condition to undergo the procedure. The anesthesia                         plan was to use monitored anesthesia care (MAC).                         Immediately prior to administration of medications,                         the patient was re-assessed for adequacy to receive                         sedatives. The heart rate, respiratory rate, oxygen                         saturations, blood pressure, adequacy of pulmonary                         ventilation, and response to care were monitored                         throughout the procedure. The physical status of the                         patient was re-assessed after the procedure.                        After obtaining informed consent, the colonoscope was                         passed under direct vision. Throughout the procedure,                         the patient's blood pressure, pulse, and oxygen                         saturations were monitored continuously. The                         Colonoscope was introduced through the anus and                         advanced to the the cecum, identified by the ileocecal  valve. The colonoscopy was performed without                         difficulty. The patient tolerated the procedure well.                         The quality of the bowel preparation was not adequate                         to identify polyps 6 mm and larger in size. Findings:      The perianal and digital rectal examinations were normal.      A large amount of semi-liquid stool was found in the entire colon,       making visualization difficult. Lavage of the area was performed using a       large amount, resulting in incomplete clearance with fair visualization.      Non-bleeding internal hemorrhoids were found during retroflexion. The        hemorrhoids were Grade I (internal hemorrhoids that do not prolapse).      The exam was otherwise without abnormality on direct and retroflexion       views. Impression:            - Preparation of the colon was inadequate.                        - Stool in the entire examined colon.                        - Non-bleeding internal hemorrhoids.                        - The examination was otherwise normal on direct and                         retroflexion views.                        - No specimens collected. Recommendation:        - Discharge patient to home.                        - Resume previous diet.                        - Continue present medications.                        - Repeat colonoscopy at the next available appointment                         for surveillance.                        - Return to referring physician as previously                         scheduled. Procedure Code(s):     --- Professional ---                        B8466, Colorectal cancer screening; colonoscopy on  individual at high risk Diagnosis Code(s):     --- Professional ---                        Z86.010, Personal history of colonic polyps                        K64.0, First degree hemorrhoids CPT copyright 2019 American Medical Association. All rights reserved. The codes documented in this report are preliminary and upon coder review may  be revised to meet current compliance requirements. Andrey Farmer, MD Andrey Farmer MD, MD 05/08/2020 1:34:57 PM Number of Addenda: 0 Note Initiated On: 05/08/2020 12:03 PM Scope Withdrawal Time: 0 hours 3 minutes 28 seconds  Total Procedure Duration: 0 hours 16 minutes 38 seconds  Estimated Blood Loss:  Estimated blood loss: none.      [facility]

## 2020-05-08 NOTE — Anesthesia Preprocedure Evaluation (Signed)
Anesthesia Evaluation  Patient identified by MRN, date of birth, ID band Patient awake    Reviewed: Allergy & Precautions, H&P , NPO status , Patient's Chart, lab work & pertinent test results, reviewed documented beta blocker date and time   History of Anesthesia Complications Negative for: history of anesthetic complications  Airway Mallampati: I  TM Distance: >3 FB Neck ROM: full    Dental  (+) Dental Advidsory Given, Caps, Teeth Intact   Pulmonary neg shortness of breath, COPD, neg recent URI, Current Smoker,    Pulmonary exam normal breath sounds clear to auscultation       Cardiovascular Exercise Tolerance: Good hypertension, (-) angina(-) Past MI and (-) Cardiac Stents Normal cardiovascular exam(-) dysrhythmias (-) Valvular Problems/Murmurs Rhythm:regular Rate:Normal     Neuro/Psych negative neurological ROS  negative psych ROS   GI/Hepatic Neg liver ROS, GERD  ,  Endo/Other  negative endocrine ROS  Renal/GU negative Renal ROS  negative genitourinary   Musculoskeletal   Abdominal   Peds  Hematology negative hematology ROS (+)   Anesthesia Other Findings Past Medical History: 2018: BPH (benign prostatic hyperplasia) No date: COPD (chronic obstructive pulmonary disease) ([place]) 2014: Diastasis recti No date: Dyslipidemia No date: GERD (gastroesophageal reflux disease) No date: Hypertension No date: Scrotal mass     Comment:  Left, USS showed 6/16 at [facility], noted also on exam   Reproductive/Obstetrics negative OB ROS                             Anesthesia Physical Anesthesia Plan  ASA: II  Anesthesia Plan: General   Post-op Pain Management:    Induction: Intravenous  PONV Risk Score and Plan: 1 and Propofol infusion and TIVA  Airway Management Planned: Natural Airway and Nasal Cannula  Additional Equipment:   Intra-op Plan:   Post-operative Plan:   Informed Consent:  I have reviewed the patients History and Physical, chart, labs and discussed the procedure including the risks, benefits and alternatives for the proposed anesthesia with the patient or authorized representative who has indicated his/her understanding and acceptance.     Dental Advisory Given  Plan Discussed with: Anesthesiologist, CRNA and Surgeon  Anesthesia Plan Comments:         Anesthesia Quick Evaluation

## 2020-05-11 ENCOUNTER — Encounter: Payer: Self-pay | Admitting: Gastroenterology

## 2020-07-01 ENCOUNTER — Other Ambulatory Visit: Payer: Self-pay

## 2020-07-01 ENCOUNTER — Other Ambulatory Visit
Admission: RE | Admit: 2020-07-01 | Discharge: 2020-07-01 | Disposition: A | Discharge status: Home / Self Care | Payer: Medicare Other | Source: Ambulatory Visit | Attending: Gastroenterology | Admitting: Gastroenterology

## 2020-07-01 DIAGNOSIS — Z20822 Contact with and (suspected) exposure to covid-19: Secondary | ICD-10-CM | POA: Diagnosis not present

## 2020-07-01 DIAGNOSIS — Z01812 Encounter for preprocedural laboratory examination: Secondary | ICD-10-CM | POA: Diagnosis present

## 2020-07-01 LAB — SARS CORONAVIRUS 2 (TAT 6-24 HRS): SARS Coronavirus 2: NEGATIVE

## 2020-07-03 ENCOUNTER — Other Ambulatory Visit: Payer: Self-pay

## 2020-07-03 ENCOUNTER — Ambulatory Visit
Admission: RE | Admit: 2020-07-03 | Discharge: 2020-07-03 | Disposition: A | Discharge status: Home / Self Care | Payer: Medicare Other | Attending: Gastroenterology | Admitting: Gastroenterology

## 2020-07-03 ENCOUNTER — Encounter
Admission: RE | Disposition: A | Discharge status: Home / Self Care | Payer: Self-pay | Source: Home / Self Care | Attending: Gastroenterology

## 2020-07-03 ENCOUNTER — Encounter: Payer: Self-pay | Admitting: *Deleted

## 2020-07-03 ENCOUNTER — Ambulatory Visit: Payer: Medicare Other | Admitting: Certified Registered Nurse Anesthetist

## 2020-07-03 DIAGNOSIS — D122 Benign neoplasm of ascending colon: Secondary | ICD-10-CM | POA: Diagnosis not present

## 2020-07-03 DIAGNOSIS — Z79899 Other long term (current) drug therapy: Secondary | ICD-10-CM | POA: Diagnosis not present

## 2020-07-03 DIAGNOSIS — N400 Enlarged prostate without lower urinary tract symptoms: Secondary | ICD-10-CM | POA: Diagnosis not present

## 2020-07-03 DIAGNOSIS — K5730 Diverticulosis of large intestine without perforation or abscess without bleeding: Secondary | ICD-10-CM | POA: Insufficient documentation

## 2020-07-03 DIAGNOSIS — Z7951 Long term (current) use of inhaled steroids: Secondary | ICD-10-CM | POA: Insufficient documentation

## 2020-07-03 DIAGNOSIS — J449 Chronic obstructive pulmonary disease, unspecified: Secondary | ICD-10-CM | POA: Insufficient documentation

## 2020-07-03 DIAGNOSIS — I10 Essential (primary) hypertension: Secondary | ICD-10-CM | POA: Insufficient documentation

## 2020-07-03 DIAGNOSIS — K640 First degree hemorrhoids: Secondary | ICD-10-CM | POA: Diagnosis not present

## 2020-07-03 DIAGNOSIS — Z1211 Encounter for screening for malignant neoplasm of colon: Secondary | ICD-10-CM | POA: Diagnosis present

## 2020-07-03 DIAGNOSIS — E785 Hyperlipidemia, unspecified: Secondary | ICD-10-CM | POA: Insufficient documentation

## 2020-07-03 DIAGNOSIS — Z87891 Personal history of nicotine dependence: Secondary | ICD-10-CM | POA: Diagnosis not present

## 2020-07-03 DIAGNOSIS — Z86010 Personal history of colonic polyps: Secondary | ICD-10-CM | POA: Diagnosis not present

## 2020-07-03 HISTORY — PX: COLONOSCOPY: SHX5424

## 2020-07-03 SURGERY — COLONOSCOPY
Anesthesia: General

## 2020-07-03 MED ORDER — PROPOFOL 10 MG/ML IV BOLUS
INTRAVENOUS | Status: AC
  Filled 2020-07-03: qty 20

## 2020-07-03 MED ORDER — SODIUM CHLORIDE 0.9 % IV SOLN: INTRAVENOUS | Status: DC

## 2020-07-03 MED ORDER — LIDOCAINE HCL (CARDIAC) PF 100 MG/5ML IV SOSY
PREFILLED_SYRINGE | INTRAVENOUS | Status: DC | PRN
  Administered 2020-07-03: 50 mg via INTRAVENOUS

## 2020-07-03 MED ORDER — PROPOFOL 500 MG/50ML IV EMUL
INTRAVENOUS | Status: AC
  Filled 2020-07-03: qty 50

## 2020-07-03 MED ORDER — PROPOFOL 10 MG/ML IV BOLUS
INTRAVENOUS | Status: DC | PRN
  Administered 2020-07-03: 125 ug/kg/min via INTRAVENOUS

## 2020-07-03 MED ORDER — PHENYLEPHRINE HCL (PRESSORS) 10 MG/ML IV SOLN
INTRAVENOUS | Status: DC | PRN
  Administered 2020-07-03 (×3): 100 ug via INTRAVENOUS
  Administered 2020-07-03: 50 ug via INTRAVENOUS

## 2020-07-03 NOTE — Interval H&P Note (Signed)
History and Physical Interval Note:  07/03/2020 8:02 AM  Lucas Schaefer  has presented today for surgery, with the diagnosis of GERD,HX.OF COLON POLYPS.  The various methods of treatment have been discussed with the patient and family. After consideration of risks, benefits and other options for treatment, the patient has consented to  Procedure(s): ESOPHAGOGASTRODUODENOSCOPY (EGD) (N/A) COLONOSCOPY (N/A) as a surgical intervention.  The patient's history has been reviewed, patient examined, no change in status, stable for surgery.  I have reviewed the patient's chart and labs.  Questions were answered to the patient's satisfaction.     Lesly Rubenstein  Ok to proceed with colonoscopy

## 2020-07-03 NOTE — H&P (Signed)
Outpatient short stay form Pre-procedure 07/03/2020 8:00 AM Raylene Miyamoto MD, MPH  Primary Physician: Dr. 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 History of inguinal hernia repair. No blood thinners.    Current Facility-Administered Medications:  .  0.9 %  sodium chloride infusion, , Intravenous, Continuous, , Hilton Cork, MD, Last Rate: 20 mL/hr at 07/03/20 0752, New Bag at 07/03/20 0752  Medications Prior to Admission  Medication Sig Dispense Refill Last Dose  . albuterol (PROVENTIL) (2.5 MG/3ML) 0.083% nebulizer solution Take 2.5 mg by nebulization every 6 (six) hours as needed for wheezing or shortness of breath.   07/02/2020 at Unknown time  . albuterol (VENTOLIN HFA) 108 (90 Base) MCG/ACT inhaler 2 puffs every 4 (four) hours as needed for wheezing or shortness of breath.    07/02/2020 at Unknown time  . amLODipine (NORVASC) 10 MG tablet Take by mouth.   07/02/2020 at Unknown time  . budesonide-formoterol (SYMBICORT) 160-4.5 MCG/ACT inhaler Inhale into the lungs.   07/02/2020 at Unknown time  . Buprenorphine HCl-Naloxone HCl 8-2 MG FILM Place 8mg  under the tongue daily   07/03/2020 at Unknown time  . CHANTIX CONTINUING MONTH PAK 1 MG tablet Take 1 mg by mouth 2 (two) times daily.   07/02/2020 at Unknown time  . lisinopril-hydrochlorothiazide (ZESTORETIC) 20-12.5 MG tablet Take 1 tablet by mouth daily.   07/02/2020 at Unknown time  . nicotine (NICOTROL) 10 MG inhaler Inhale into the lungs.   07/02/2020 at Unknown time  . omeprazole (PRILOSEC) 40 MG capsule Take 40 mg by mouth daily.   07/02/2020 at Unknown time  . pravastatin (PRAVACHOL) 40 MG tablet Take 40 mg by mouth daily.   07/02/2020 at Unknown time  . pravastatin (PRAVACHOL) 80 MG tablet Take 80 mg by mouth daily.   07/02/2020 at Unknown time  . PREVIDENT 5000 SENSITIVE 1.1-5 % PSTE Place 1 application onto teeth 2  (two) times daily.     . sildenafil (REVATIO) 20 MG tablet Take 20 mg by mouth daily. Take 5 tablets (100mg  total) once daily as needed. Pulm. hypertension     . tamsulosin (FLOMAX) 0.4 MG CAPS capsule Take by mouth. (Patient not taking: Reported on 05/08/2020)   Not Taking at Unknown time     No Known Allergies   Past Medical History:  Diagnosis Date  . BPH (benign prostatic hyperplasia) 2018  . COPD (chronic obstructive pulmonary disease) ([place])   . Diastasis recti 2014  . Dyslipidemia   . GERD (gastroesophageal reflux disease)   . Hypertension   . Scrotal mass    Left, USS showed 6/16 at [facility], noted also on exam    Review of systems:  Otherwise negative.    Physical Exam  Gen: Alert, oriented. Appears stated age.  HEENT: [place]/AT. PERRLA. Lungs: No respiratory distress Abd: soft, benign, no masses. BS+ Ext: No edema. Pulses 2+    Planned procedures: Proceed with colonoscopy. The patient understands the nature of the planned procedure, indications, risks, alternatives and potential complications including but not limited to bleeding, infection, perforation, damage to internal organs and possible oversedation/side effects from anesthesia. The patient agrees and gives consent to proceed.  Please refer to procedure notes for findings, recommendations and patient disposition/instructions.     Raylene Miyamoto MD, MPH Gastroenterology 07/03/2020  8:00 AM

## 2020-07-03 NOTE — Op Note (Signed)
[facility] Gastroenterology Patient Name: Lucas Schaefer Procedure Date: 07/03/2020 8:03 AM MRN: [id number] Account #: [id number] Date of Birth: Sep 29, 1951 Admit Type: Outpatient Age: 69 Room: [facility] ENDO ROOM 3 Gender: Male Note Status: Finalized Procedure:             Colonoscopy Indications:           High risk colon cancer surveillance: Personal history                         of non-advanced adenoma Providers:             Andrey Farmer MD, MD Referring MD:          No Local Md, MD (Referring MD) Medicines:             Monitored Anesthesia Care Complications:         No immediate complications. Estimated blood loss:                         Minimal. Procedure:             Pre-Anesthesia Assessment:                        - Prior to the procedure, a History and Physical was                         performed, and patient medications and allergies were                         reviewed. The patient is competent. The risks and                         benefits of the procedure and the sedation options and                         risks were discussed with the patient. All questions                         were answered and informed consent was obtained.                         Patient identification and proposed procedure were                         verified by the physician, the nurse, the anesthetist                         and the technician in the endoscopy suite. Mental                         Status Examination: alert and oriented. Airway                         Examination: normal oropharyngeal airway and neck                         mobility. Respiratory Examination: clear to  auscultation. CV Examination: normal. Prophylactic                         Antibiotics: The patient does not require prophylactic                         antibiotics. Prior Anticoagulants: The patient has                         taken no previous anticoagulant or  antiplatelet                         agents. ASA Grade Assessment: III - A patient with                         severe systemic disease. After reviewing the risks and                         benefits, the patient was deemed in satisfactory                         condition to undergo the procedure. The anesthesia                         plan was to use monitored anesthesia care (MAC).                         Immediately prior to administration of medications,                         the patient was re-assessed for adequacy to receive                         sedatives. The heart rate, respiratory rate, oxygen                         saturations, blood pressure, adequacy of pulmonary                         ventilation, and response to care were monitored                         throughout the procedure. The physical status of the                         patient was re-assessed after the procedure.                        After obtaining informed consent, the colonoscope was                         passed under direct vision. Throughout the procedure,                         the patient's blood pressure, pulse, and oxygen                         saturations were monitored continuously. The  Colonoscope was introduced through the anus and                         advanced to the the cecum, identified by appendiceal                         orifice and ileocecal valve. The colonoscopy was                         performed without difficulty. The patient tolerated                         the procedure well. The quality of the bowel                         preparation was adequate to identify polyps. Findings:      The perianal and digital rectal examinations were normal.      A 3 mm polyp was found in the ascending colon. The polyp was sessile.       The polyp was removed with a cold snare. Resection and retrieval were       complete. Estimated blood loss was minimal.       A few small-mouthed diverticula were found in the sigmoid colon.      Internal hemorrhoids were found during retroflexion. The hemorrhoids       were Grade I (internal hemorrhoids that do not prolapse).      The exam was otherwise without abnormality on direct and retroflexion       views. Impression:            - One 3 mm polyp in the ascending colon, removed with                         a cold snare. Resected and retrieved.                        - Diverticulosis in the sigmoid colon.                        - Internal hemorrhoids.                        - The examination was otherwise normal on direct and                         retroflexion views. Recommendation:        - Discharge patient to home.                        - Resume previous diet.                        - Continue present medications.                        - Await pathology results.                        - Repeat colonoscopy in 5 years for surveillance.                        -  Return to referring physician as previously                         scheduled. Procedure Code(s):     --- Professional ---                        [phone], Colonoscopy, flexible; with removal of                         tumor(s), polyp(s), or other lesion(s) by snare                         technique Diagnosis Code(s):     --- Professional ---                        Z86.010, Personal history of colonic polyps                        K63.5, Polyp of colon                        K64.0, First degree hemorrhoids                        K57.30, Diverticulosis of large intestine without                         perforation or abscess without bleeding CPT copyright 2019 [employer]. All rights reserved. The codes documented in this report are preliminary and upon coder review may  be revised to meet current compliance requirements. Andrey Farmer, MD Andrey Farmer MD, MD 07/03/2020 8:34:28 AM Number of Addenda: 0 Note Initiated On:  07/03/2020 8:03 AM Scope Withdrawal Time: 0 hours 13 minutes 26 seconds  Total Procedure Duration: 0 hours 20 minutes 22 seconds  Estimated Blood Loss:  Estimated blood loss was minimal.      [facility]

## 2020-07-03 NOTE — Transfer of Care (Signed)
Immediate Anesthesia Transfer of Care Note  Patient: Lucas Schaefer  Procedure(s) Performed: COLONOSCOPY (N/A )  Patient Location: PACU  Anesthesia Type:General  Level of Consciousness: awake, alert  and oriented  Airway & Oxygen Therapy: Patient Spontanous Breathing  Post-op Assessment: Report given to RN and Post -op Vital signs reviewed and stable  Post vital signs: Reviewed and stable  Last Vitals:  Vitals Value Taken Time  BP    Temp 35.7 C 07/03/20 0831  Pulse 54 07/03/20 0831  Resp 11 07/03/20 0831  SpO2 97 % 07/03/20 0831  Vitals shown include unvalidated device data.  Last Pain:  Vitals:   07/03/20 0831  TempSrc: Temporal  PainSc:          Complications: No complications documented.

## 2020-07-03 NOTE — Anesthesia Preprocedure Evaluation (Addendum)
Anesthesia Evaluation  Patient identified by MRN, date of birth, ID band Patient awake    Reviewed: Allergy & Precautions, H&P , NPO status , Patient's Chart, lab work & pertinent test results  History of Anesthesia Complications Negative for: history of anesthetic complications  Airway Mallampati: II  TM Distance: >3 FB     Dental  (+) Teeth Intact   Pulmonary neg sleep apnea, COPD, former smoker,    breath sounds clear to auscultation       Cardiovascular hypertension, (-) angina(-) Past MI and (-) Cardiac Stents (-) dysrhythmias  Rhythm:regular Rate:Normal     Neuro/Psych negative neurological ROS  negative psych ROS   GI/Hepatic Neg liver ROS, GERD  Controlled,  Endo/Other  negative endocrine ROS  Renal/GU negative Renal ROS  negative genitourinary   Musculoskeletal   Abdominal   Peds  Hematology negative hematology ROS (+)   Anesthesia Other Findings Past Medical History: 2018: BPH (benign prostatic hyperplasia) No date: COPD (chronic obstructive pulmonary disease) ([place]) 2014: Diastasis recti No date: Dyslipidemia No date: GERD (gastroesophageal reflux disease) No date: Hypertension No date: Scrotal mass     Comment:  Left, USS showed 6/16 at [facility], noted also on exam  Past Surgical History: 05/08/2020: COLONOSCOPY WITH PROPOFOL; N/A     Comment:  Procedure: COLONOSCOPY WITH PROPOFOL;  Surgeon:               Lesly Rubenstein, MD;  Location: [facility] ENDOSCOPY;                Service: Gastroenterology;  Laterality: N/A; 05/08/2020: ESOPHAGOGASTRODUODENOSCOPY (EGD) WITH PROPOFOL; N/A     Comment:  Procedure: ESOPHAGOGASTRODUODENOSCOPY (EGD) WITH               PROPOFOL;  Surgeon: Lesly Rubenstein, MD;  Location:               [facility] ENDOSCOPY;  Service: Gastroenterology;  Laterality:               N/A; 2016: HERNIA REPAIR     Comment:  umbilical hernia  BMI    Body Mass Index: 25.84 kg/m       Reproductive/Obstetrics negative OB ROS                            Anesthesia Physical Anesthesia Plan  ASA: II  Anesthesia Plan: General   Post-op Pain Management:    Induction:   PONV Risk Score and Plan: Propofol infusion and TIVA  Airway Management Planned:   Additional Equipment:   Intra-op Plan:   Post-operative Plan:   Informed Consent: I have reviewed the patients History and Physical, chart, labs and discussed the procedure including the risks, benefits and alternatives for the proposed anesthesia with the patient or authorized representative who has indicated his/her understanding and acceptance.     Dental Advisory Given  Plan Discussed with: Anesthesiologist, CRNA and Surgeon  Anesthesia Plan Comments:        Anesthesia Quick Evaluation

## 2020-07-04 NOTE — Anesthesia Postprocedure Evaluation (Signed)
Anesthesia Post Note  Patient: Lucas Schaefer  Procedure(s) Performed: COLONOSCOPY (N/A )  Patient location during evaluation: PACU Anesthesia Type: General Level of consciousness: awake and alert Pain management: pain level controlled Vital Signs Assessment: post-procedure vital signs reviewed and stable Respiratory status: spontaneous breathing, nonlabored ventilation and respiratory function stable Cardiovascular status: blood pressure returned to baseline and stable Postop Assessment: no apparent nausea or vomiting Anesthetic complications: no   No complications documented.   Last Vitals:  Vitals:   07/03/20 0841 07/03/20 0851  BP: (!) 99/56 123/71  Pulse: (!) 56 64  Resp: 11 16  Temp:    SpO2: 98% 97%    Last Pain:  Vitals:   07/03/20 0851  TempSrc:   PainSc: 0-No pain                 Brett Canales 

## 2020-07-06 ENCOUNTER — Encounter: Payer: Self-pay | Admitting: Gastroenterology

## 2020-07-06 LAB — SURGICAL PATHOLOGY

## 2023-03-20 ENCOUNTER — Ambulatory Visit
Admission: EM | Admit: 2023-03-20 | Discharge: 2023-03-20 | Disposition: A | Discharge status: Home / Self Care | Payer: 59

## 2023-03-20 DIAGNOSIS — R0602 Shortness of breath: Secondary | ICD-10-CM | POA: Diagnosis not present

## 2023-03-20 DIAGNOSIS — R051 Acute cough: Secondary | ICD-10-CM

## 2023-03-20 DIAGNOSIS — J441 Chronic obstructive pulmonary disease with (acute) exacerbation: Secondary | ICD-10-CM | POA: Diagnosis not present

## 2023-03-20 MED ORDER — PROMETHAZINE-DM 6.25-15 MG/5ML PO SYRP: 5.0000 mL | ORAL_SOLUTION | Freq: Four times a day (QID) | ORAL | 0 refills | Status: DC | PRN

## 2023-03-20 MED ORDER — PREDNISONE 20 MG PO TABS: 40.0000 mg | ORAL_TABLET | Freq: Every day | ORAL | 0 refills | Status: AC

## 2023-03-20 MED ORDER — AZITHROMYCIN 250 MG PO TABS: 250.0000 mg | ORAL_TABLET | Freq: Every day | ORAL | 0 refills | Status: DC

## 2023-03-20 NOTE — ED Triage Notes (Signed)
Pt c/o cough & wheezing x4 days. Hx of COPD. Has tried tylenol w/o relief.

## 2023-03-20 NOTE — Discharge Instructions (Addendum)
-  Come back tomorrow to the main entrance for chest x-ray. - I sent antibiotics, corticosteroids and cough medicine to the pharmacy.  Increase rest and fluids. - I will call you to get the results of your chest x-ray.  If it shows pneumonia I may add an additional antibiotic for you. -Continue use of inhalers. - Follow-up with pulmonologist and PCP as advised.

## 2023-03-20 NOTE — ED Provider Notes (Signed)
[facility]    CSN: [id number] Arrival date & time: 03/20/23  1155      History   Chief Complaint Chief Complaint  Patient presents with   Cough   Wheezing    HPI Lucas Schaefer is a 72 y.o. male presenting for approximate 4-day history of productive cough, congestion and wheezing.  Patient denies fever or fatigue.  Mild sore throat which has mostly resolved.  Patient does feel little more short of breath than normal.  He does have a history of COPD.  Has been using inhalers at home.  Patient is worried about his symptoms potentially turning into pneumonia.  He did see his pulmonologist at the end of May and was noted to have an abnormal chest x-ray and told he had a touch of pneumonia.  He says he never took any antibiotics at that time.  Patient reports breaking his ribs almost exactly 2 months ago.  He denies any recent sick contacts or known exposure to COVID.  He has been taking OTC meds.  No other complaints.  HPI  Past Medical History:  Diagnosis Date   BPH (benign prostatic hyperplasia) 2018   COPD (chronic obstructive pulmonary disease) (HCC)    Diastasis recti 2014   Dyslipidemia    GERD (gastroesophageal reflux disease)    Hypertension    Scrotal mass    Left, USS showed 6/16 at [facility], noted also on exam    Patient Active Problem List   Diagnosis Date Noted   Onychomycosis 05/15/2018   Chronic alcoholic gastritis without hemorrhage 12/06/2017   Scrotal mass 09/22/2017   Benign prostatic hyperplasia with incomplete bladder emptying 08/14/2017   Gastroesophageal reflux disease without esophagitis 08/14/2017   Benign essential HTN 02/03/2016   Chronic obstructive pulmonary disease (HCC) 02/03/2016   Dyslipidemia 02/03/2016   Family history of premature CAD 02/03/2016   SOB (shortness of breath) on exertion 02/03/2016   Diastasis recti 05/09/2013    Past Surgical History:  Procedure Laterality Date   COLONOSCOPY N/A 07/03/2020   Procedure:  COLONOSCOPY;  Surgeon: Regis Bill, MD;  Location: [facility] ENDOSCOPY;  Service: Endoscopy;  Laterality: N/A;   COLONOSCOPY WITH PROPOFOL N/A 05/08/2020   Procedure: COLONOSCOPY WITH PROPOFOL;  Surgeon: Regis Bill, MD;  Location: [facility] ENDOSCOPY;  Service: Gastroenterology;  Laterality: N/A;   ESOPHAGOGASTRODUODENOSCOPY (EGD) WITH PROPOFOL N/A 05/08/2020   Procedure: ESOPHAGOGASTRODUODENOSCOPY (EGD) WITH PROPOFOL;  Surgeon: Regis Bill, MD;  Location: [facility] ENDOSCOPY;  Service: Gastroenterology;  Laterality: N/A;   HERNIA REPAIR  2016   umbilical hernia       Home Medications    Prior to Admission medications   Medication Sig Start Date End Date Taking? Authorizing Provider  albuterol (PROVENTIL) (2.5 MG/3ML) 0.083% nebulizer solution Take 2.5 mg by nebulization every 6 (six) hours as needed for wheezing or shortness of breath.   Yes [provider]  albuterol (VENTOLIN HFA) 108 (90 Base) MCG/ACT inhaler 2 puffs every 4 (four) hours as needed for wheezing or shortness of breath.  11/28/19  Yes [provider]  amLODipine (NORVASC) 10 MG tablet Take by mouth. 11/29/19 03/20/23 Yes [provider]  aspirin EC 81 MG tablet Take by mouth.   Yes [provider]  azithromycin (ZITHROMAX) 250 MG tablet Take 1 tablet (250 mg total) by mouth daily. Take first 2 tablets together, then 1 every day until finished. 03/20/23  Yes Eusebio Friendly B, PA-C  budesonide-formoterol (SYMBICORT) 160-4.5 MCG/ACT inhaler Inhale into the lungs. 05/21/18  Yes [provider]  Buprenorphine HCl-Naloxone HCl 8-2 MG FILM Place 8mg  under the tongue daily   Yes [provider]  CHANTIX CONTINUING MONTH PAK 1 MG tablet Take 1 mg by mouth 2 (two) times daily. 05/27/19  Yes [provider]  lisinopril-hydrochlorothiazide (ZESTORETIC) 20-12.5 MG tablet Take 1 tablet by mouth daily. 07/14/19  Yes [provider]  nicotine (NICOTROL) 10 MG inhaler  Inhale into the lungs. 05/15/18  Yes [provider]  omeprazole (PRILOSEC) 40 MG capsule Take 40 mg by mouth daily. 10/09/19  Yes [provider]  pravastatin (PRAVACHOL) 40 MG tablet Take 40 mg by mouth daily. 10/09/19  Yes [provider]  pravastatin (PRAVACHOL) 80 MG tablet Take 80 mg by mouth daily.   Yes [provider]  predniSONE (DELTASONE) 20 MG tablet Take 2 tablets (40 mg total) by mouth daily for 5 days. 03/20/23 03/25/23 Yes Eusebio Friendly B, PA-C  PREVIDENT 5000 SENSITIVE 1.1-5 % PSTE Place 1 application onto teeth 2 (two) times daily. 10/14/19  Yes [provider]  promethazine-dextromethorphan (PROMETHAZINE-DM) 6.25-15 MG/5ML syrup Take 5 mLs by mouth 4 (four) times daily as needed. 03/20/23  Yes Eusebio Friendly B, PA-C  sildenafil (REVATIO) 20 MG tablet Take 20 mg by mouth daily. Take 5 tablets (100mg  total) once daily as needed. Pulm. hypertension   Yes [provider]  tamsulosin (FLOMAX) 0.4 MG CAPS capsule Take by mouth. 07/27/20  Yes [provider]    Family History History reviewed. No pertinent family history.  Social History Social History   Tobacco Use   Smoking status: Former    Types: Cigars, Cigarettes   Smokeless tobacco: Never  Vaping Use   Vaping Use: Never used  Substance Use Topics   Alcohol use: Yes    Alcohol/week: 6.0 standard drinks of alcohol    Types: 6 Cans of beer per week    Comment: 6 beer/day   Drug use: Never     Allergies   Patient has no known allergies.   Review of Systems Review of Systems  Constitutional:  Negative for fatigue and fever.  HENT:  Positive for congestion, rhinorrhea and sore throat. Negative for sinus pressure and sinus pain.   Respiratory:  Positive for cough, shortness of breath and wheezing.   Cardiovascular:  Negative for chest pain.  Gastrointestinal:  Negative for abdominal pain, diarrhea, nausea and vomiting.  Musculoskeletal:  Negative for  myalgias.  Neurological:  Negative for weakness, light-headedness and headaches.  Hematological:  Negative for adenopathy.     Physical Exam Triage Vital Signs ED Triage Vitals  Enc Vitals Group     BP 03/20/23 1304 (!) 153/83     Pulse Rate 03/20/23 1304 75     Resp 03/20/23 1304 16     Temp 03/20/23 1304 98.4 F (36.9 C)     Temp Source 03/20/23 1304 Oral     SpO2 03/20/23 1304 99 %     Weight 03/20/23 1304 180 lb (81.6 kg)     Height 03/20/23 1304 5\' 9"  (1.753 m)     Head Circumference --      Peak Flow --      Pain Score 03/20/23 1314 0     Pain Loc --      Pain Edu? --      Excl. in GC? --    No data found.  Updated Vital Signs BP (!) 153/83 (BP Location: Left Arm)   Pulse 75   Temp 98.4  F (36.9 C) (Oral)   Resp 16   Ht 5\' 9"  (1.753 m)   Wt 180 lb (81.6 kg)   SpO2 99%   BMI 26.58 kg/m    Physical Exam Vitals and nursing note reviewed.  Constitutional:      General: He is not in acute distress.    Appearance: Normal appearance. He is well-developed. He is not ill-appearing.  HENT:     Head: Normocephalic and atraumatic.     Nose: Nose normal.     Mouth/Throat:     Mouth: Mucous membranes are moist.     Pharynx: Oropharynx is clear.  Eyes:     General: No scleral icterus.    Conjunctiva/sclera: Conjunctivae normal.  Cardiovascular:     Rate and Rhythm: Normal rate and regular rhythm.     Heart sounds: Normal heart sounds.  Pulmonary:     Effort: Pulmonary effort is normal. No respiratory distress.     Breath sounds: Wheezing present.  Musculoskeletal:     Cervical back: Neck supple.  Skin:    General: Skin is warm and dry.     Capillary Refill: Capillary refill takes less than 2 seconds.  Neurological:     General: No focal deficit present.     Mental Status: He is alert. Mental status is at baseline.     Motor: No weakness.     Gait: Gait normal.  Psychiatric:        Mood and Affect: Mood normal.        Behavior: Behavior normal.       UC Treatments / Results  Labs (all labs ordered are listed, but only abnormal results are displayed) Labs Reviewed - No data to display  EKG   Radiology No results found.  Procedures Procedures (including critical care time)  Medications Ordered in UC Medications - No data to display  Initial Impression / Assessment and Plan / UC Course  I have reviewed the triage vital signs and the nursing notes.  Pertinent labs & imaging results that were available during my care of the patient were reviewed by me and considered in my medical decision making (see chart for details).   47 y/ male with history of COPD presents for 4-day history of cough, congestion, wheezing and shortness of breath.  No sick contacts.  He is afebrile and overall well-appearing.  No acute distress.  On exam he has few scattered wheezes throughout lung fields.  Review of medical records shows diagnosis of left upper lobe pneumonia on 03/02/2023.  Patient denies being prescribed any antibiotics and I cannot see where he was given any antibiotics in the system.  Advised patient to repeat the chest x-ray.  Patient says he has been waiting in the urgent care for a long time and family is waiting on the vehicle for him.  He would like to return tomorrow to complete the chest x-ray.  I will order this is a future order.  Will treat patient for COPD exacerbation at this time with azithromycin, prednisone, Promethazine DM.  Advised to continue inhalers.  May amend treatment or add an additional antibiotic based on chest x-ray once I get the result.  Advised to follow-up with pulmonologist.  Reviewed return and ER precautions.   Final Clinical Impressions(s) / UC Diagnoses   Final diagnoses:  COPD exacerbation (HCC)  Acute cough  Shortness of breath     Discharge Instructions      -Come back tomorrow to the main entrance for chest  x-ray. - I sent antibiotics, corticosteroids and cough medicine to the  pharmacy.  Increase rest and fluids. - I will call you to get the results of your chest x-ray.  If it shows pneumonia I may add an additional antibiotic for you. -Continue use of inhalers. - Follow-up with pulmonologist and PCP as advised.    ED Prescriptions     Medication Sig Dispense Auth. Provider   azithromycin (ZITHROMAX) 250 MG tablet Take 1 tablet (250 mg total) by mouth daily. Take first 2 tablets together, then 1 every day until finished. 6 tablet Eusebio Friendly B, PA-C   predniSONE (DELTASONE) 20 MG tablet Take 2 tablets (40 mg total) by mouth daily for 5 days. 10 tablet Shirlee Latch, PA-C   promethazine-dextromethorphan (PROMETHAZINE-DM) 6.25-15 MG/5ML syrup Take 5 mLs by mouth 4 (four) times daily as needed. 118 mL Shirlee Latch, PA-C      PDMP not reviewed this encounter.   Shirlee Latch, PA-C 03/20/23 1423

## 2023-03-21 ENCOUNTER — Ambulatory Visit
Admission: RE | Admit: 2023-03-21 | Discharge: 2023-03-21 | Disposition: A | Discharge status: Home / Self Care | Payer: 59 | Source: Ambulatory Visit | Attending: Physician Assistant | Admitting: Physician Assistant

## 2023-03-21 ENCOUNTER — Ambulatory Visit
Admission: RE | Admit: 2023-03-21 | Discharge: 2023-03-21 | Disposition: A | Discharge status: Home / Self Care | Payer: 59 | Attending: Physician Assistant | Admitting: Physician Assistant

## 2023-03-21 ENCOUNTER — Telehealth: Payer: Self-pay | Admitting: Physician Assistant

## 2023-03-21 DIAGNOSIS — X58XXXD Exposure to other specified factors, subsequent encounter: Secondary | ICD-10-CM | POA: Insufficient documentation

## 2023-03-21 DIAGNOSIS — S2241XD Multiple fractures of ribs, right side, subsequent encounter for fracture with routine healing: Secondary | ICD-10-CM | POA: Insufficient documentation

## 2023-03-21 DIAGNOSIS — R059 Cough, unspecified: Secondary | ICD-10-CM | POA: Diagnosis present

## 2023-03-21 DIAGNOSIS — J189 Pneumonia, unspecified organism: Secondary | ICD-10-CM

## 2023-03-21 MED ORDER — AMOXICILLIN-POT CLAVULANATE 875-125 MG PO TABS: 1.0000 | ORAL_TABLET | Freq: Two times a day (BID) | ORAL | 0 refills | Status: AC

## 2023-03-21 NOTE — Telephone Encounter (Signed)
Patient returns today for a chest x-ray.  Seen yesterday for COPD exacerbation.  Previous chest x-ray from a couple weeks ago showed concerns for left upper lobe pneumonia.  Repeat x-ray today to assess for pneumonia.  X-ray today shows possible right basilar infiltrate.  Contacted patient to discuss results.  Unable to reach patient.  Left voicemail for him.  Will add Augmentin to the azithromycin that he was given yesterday.  Advised to repeat the x-ray in 3 to 4 weeks.

## 2023-04-14 ENCOUNTER — Ambulatory Visit
Admission: RE | Admit: 2023-04-14 | Discharge: 2023-04-14 | Disposition: A | Discharge status: Home / Self Care | Payer: 59 | Attending: Pediatrics | Admitting: Pediatrics

## 2023-04-14 ENCOUNTER — Other Ambulatory Visit: Payer: Self-pay | Admitting: Pediatrics

## 2023-04-14 ENCOUNTER — Ambulatory Visit
Admission: RE | Admit: 2023-04-14 | Discharge: 2023-04-14 | Disposition: A | Discharge status: Home / Self Care | Payer: 59 | Source: Ambulatory Visit | Attending: Pediatrics | Admitting: Pediatrics

## 2023-04-14 DIAGNOSIS — J449 Chronic obstructive pulmonary disease, unspecified: Secondary | ICD-10-CM | POA: Diagnosis not present

## 2023-04-18 ENCOUNTER — Other Ambulatory Visit: Payer: Self-pay | Admitting: Pediatrics

## 2023-04-18 DIAGNOSIS — J449 Chronic obstructive pulmonary disease, unspecified: Secondary | ICD-10-CM

## 2023-04-18 DIAGNOSIS — R918 Other nonspecific abnormal finding of lung field: Secondary | ICD-10-CM

## 2023-04-18 DIAGNOSIS — Z8709 Personal history of other diseases of the respiratory system: Secondary | ICD-10-CM

## 2023-04-26 ENCOUNTER — Ambulatory Visit
Admission: RE | Admit: 2023-04-26 | Discharge: 2023-04-26 | Disposition: A | Discharge status: Home / Self Care | Payer: 59 | Source: Ambulatory Visit | Attending: Pediatrics | Admitting: Pediatrics

## 2023-04-26 DIAGNOSIS — R918 Other nonspecific abnormal finding of lung field: Secondary | ICD-10-CM | POA: Insufficient documentation

## 2023-04-26 DIAGNOSIS — Z8709 Personal history of other diseases of the respiratory system: Secondary | ICD-10-CM | POA: Diagnosis present

## 2023-04-26 DIAGNOSIS — J449 Chronic obstructive pulmonary disease, unspecified: Secondary | ICD-10-CM | POA: Diagnosis not present

## 2023-05-05 ENCOUNTER — Other Ambulatory Visit: Payer: Self-pay | Admitting: Specialist

## 2023-05-05 DIAGNOSIS — C459 Mesothelioma, unspecified: Secondary | ICD-10-CM

## 2023-05-05 DIAGNOSIS — J929 Pleural plaque without asbestos: Secondary | ICD-10-CM

## 2023-05-15 ENCOUNTER — Ambulatory Visit
Admission: RE | Admit: 2023-05-15 | Discharge: 2023-05-15 | Disposition: A | Discharge status: Home / Self Care | Payer: 59 | Source: Ambulatory Visit | Attending: Specialist | Admitting: Specialist

## 2023-05-15 DIAGNOSIS — C459 Mesothelioma, unspecified: Secondary | ICD-10-CM

## 2023-05-15 DIAGNOSIS — J929 Pleural plaque without asbestos: Secondary | ICD-10-CM

## 2023-05-16 ENCOUNTER — Ambulatory Visit
Admission: RE | Admit: 2023-05-16 | Discharge: 2023-05-16 | Disposition: A | Discharge status: Home / Self Care | Payer: 59 | Source: Ambulatory Visit | Attending: Specialist | Admitting: Specialist

## 2023-05-16 DIAGNOSIS — C459 Mesothelioma, unspecified: Secondary | ICD-10-CM | POA: Insufficient documentation

## 2023-05-16 DIAGNOSIS — J929 Pleural plaque without asbestos: Secondary | ICD-10-CM | POA: Diagnosis not present

## 2023-05-16 LAB — GLUCOSE, CAPILLARY: Glucose-Capillary: 107 mg/dL — ABNORMAL HIGH (ref 70–99)

## 2023-05-16 MED ORDER — FLUDEOXYGLUCOSE F - 18 (FDG) INJECTION
9.6900 | Freq: Once | INTRAVENOUS | Status: AC | PRN
  Administered 2023-05-16: 9.69 via INTRAVENOUS

## 2023-07-19 ENCOUNTER — Other Ambulatory Visit: Payer: Self-pay | Admitting: Specialist

## 2023-07-19 DIAGNOSIS — J61 Pneumoconiosis due to asbestos and other mineral fibers: Secondary | ICD-10-CM

## 2023-07-19 DIAGNOSIS — R911 Solitary pulmonary nodule: Secondary | ICD-10-CM

## 2023-07-19 DIAGNOSIS — R0602 Shortness of breath: Secondary | ICD-10-CM

## 2023-08-23 ENCOUNTER — Ambulatory Visit
Admission: RE | Admit: 2023-08-23 | Discharge: 2023-08-23 | Disposition: A | Discharge status: Home / Self Care | Payer: 59 | Source: Ambulatory Visit | Attending: Specialist | Admitting: Specialist

## 2023-08-23 DIAGNOSIS — R911 Solitary pulmonary nodule: Secondary | ICD-10-CM | POA: Insufficient documentation

## 2023-08-23 DIAGNOSIS — R0602 Shortness of breath: Secondary | ICD-10-CM | POA: Diagnosis present

## 2023-08-23 DIAGNOSIS — J61 Pneumoconiosis due to asbestos and other mineral fibers: Secondary | ICD-10-CM | POA: Diagnosis present

## 2023-10-31 ENCOUNTER — Ambulatory Visit (HOSPITAL_BASED_OUTPATIENT_CLINIC_OR_DEPARTMENT_OTHER)
Admission: RE | Admit: 2023-10-31 | Discharge: 2023-10-31 | Disposition: A | Discharge status: Home / Self Care | Payer: 59 | Source: Ambulatory Visit | Attending: Student | Admitting: Radiology

## 2023-10-31 ENCOUNTER — Encounter (HOSPITAL_BASED_OUTPATIENT_CLINIC_OR_DEPARTMENT_OTHER): Payer: Self-pay | Admitting: Student

## 2023-10-31 ENCOUNTER — Ambulatory Visit (INDEPENDENT_AMBULATORY_CARE_PROVIDER_SITE_OTHER): Payer: 59 | Admitting: Student

## 2023-10-31 ENCOUNTER — Other Ambulatory Visit (HOSPITAL_BASED_OUTPATIENT_CLINIC_OR_DEPARTMENT_OTHER): Payer: Self-pay

## 2023-10-31 VITALS — BP 126/89 | HR 90 | Temp 98.3°F | Ht 66.73 in | Wt 180.9 lb

## 2023-10-31 DIAGNOSIS — R0602 Shortness of breath: Secondary | ICD-10-CM

## 2023-10-31 DIAGNOSIS — E785 Hyperlipidemia, unspecified: Secondary | ICD-10-CM | POA: Diagnosis not present

## 2023-10-31 DIAGNOSIS — J439 Emphysema, unspecified: Secondary | ICD-10-CM

## 2023-10-31 DIAGNOSIS — Z7689 Persons encountering health services in other specified circumstances: Secondary | ICD-10-CM

## 2023-10-31 DIAGNOSIS — J069 Acute upper respiratory infection, unspecified: Secondary | ICD-10-CM | POA: Diagnosis not present

## 2023-10-31 DIAGNOSIS — Z131 Encounter for screening for diabetes mellitus: Secondary | ICD-10-CM | POA: Diagnosis not present

## 2023-10-31 MED ORDER — BENZONATATE 100 MG PO CAPS
100.0000 mg | ORAL_CAPSULE | Freq: Three times a day (TID) | ORAL | 0 refills | Status: DC | PRN
Start: 2023-10-31 — End: 2024-03-06
  Filled 2023-10-31: qty 20, 7d supply, fill #0

## 2023-10-31 MED ORDER — ALBUTEROL SULFATE (2.5 MG/3ML) 0.083% IN NEBU
2.5000 mg | INHALATION_SOLUTION | Freq: Four times a day (QID) | RESPIRATORY_TRACT | 4 refills | Status: AC | PRN
Start: 2023-10-31 — End: ?
  Filled 2023-10-31: qty 75, 7d supply, fill #0

## 2023-10-31 MED ORDER — ALBUTEROL SULFATE HFA 108 (90 BASE) MCG/ACT IN AERS
2.0000 | INHALATION_SPRAY | Freq: Four times a day (QID) | RESPIRATORY_TRACT | 0 refills | Status: AC | PRN
Start: 2023-10-31 — End: ?
  Filled 2023-10-31: qty 6.7, 25d supply, fill #0

## 2023-10-31 NOTE — Progress Notes (Signed)
New Patient Office Visit  Subjective    Patient ID: Lucas Schaefer, male    DOB: Mar 11, 1951  Age: 73 y.o. MRN: [id number]  CC:  Chief Complaint  Patient presents with   Establish Care    Here to establish care.   Breathing Problem    Had PNA 2-3 months ago. Having trouble breathing and shallow breathing. Saw providers in [place], [place] and had x-ray done. Would like to be reassessed.  Needs refill on albuterol-solu nebulizer.    HPI Lucas Schaefer presents to establish care. He has just moved from [place] [place] to [place]. Prior PCP was Dr. Richardine Service at [facility]. Last physical was in the last month or two.   Trouble breathing-Notes that he had pneumonia about 2 months ago. Has continued to have trouble breathing with history of the same- followed by pulmonology. Current symptoms started about 4-5 days ago. Wife came back sick (unsure of what she has) and he believes that he got sick from her. Trouble breathing yesterday evening. Mucinex for 4 days. Quit smoking 22 years ago. 35 standard drinks per week per 09/15/23 note. No fever. O2 sat 95%. Coughing is improved, was worse yesterday. Noted that he has had a productive cough. No flu shot. No swelling or rubor in legs. Patient has not been compliant on inhaler medications. Discussed proper technique of inhaler use.  History of COPD and Asthma. History of asbestosis. Pulmonary nodule. Last saw pulmonology on 09/15/23.  Wells Criteria For PE Score: 0  Screenings:  Colon Cancer: Colonoscopy ~1 year ago- clean per pt Lung Cancer: smoker. Following with pulm. Diabetes: indicated HLD: dyslipidemia on pravastatin The 10-year ASCVD risk score (Arnett DK, et al., 2019) is: 25%  Outpatient Encounter Medications as of 10/31/2023  Medication Sig   albuterol (VENTOLIN HFA) 108 (90 Base) MCG/ACT inhaler Inhale 2 puffs into the lungs every 6 (six) hours as needed for wheezing or shortness of breath.   aspirin EC 81 MG tablet Take by  mouth.   benzonatate (TESSALON PERLES) 100 MG capsule Take 1 capsule (100 mg total) by mouth 3 (three) times daily as needed for cough.   budesonide-formoterol (SYMBICORT) 160-4.5 MCG/ACT inhaler Inhale into the lungs.   Buprenorphine HCl-Naloxone HCl 8-2 MG FILM TAKING SMALL PIECES.   cyanocobalamin (VITAMIN B12) 1000 MCG tablet Take 1,000 mcg by mouth daily.   lisinopril-hydrochlorothiazide (ZESTORETIC) 20-12.5 MG tablet Take 1 tablet by mouth daily.   omeprazole (PRILOSEC) 40 MG capsule Take 40 mg by mouth daily.   pravastatin (PRAVACHOL) 80 MG tablet Take 80 mg by mouth daily.   [DISCONTINUED] albuterol (PROVENTIL) (2.5 MG/3ML) 0.083% nebulizer solution Take 2.5 mg by nebulization every 6 (six) hours as needed for wheezing or shortness of breath.   albuterol (PROVENTIL) (2.5 MG/3ML) 0.083% nebulizer solution Take 3 mLs (2.5 mg total) by nebulization every 6 (six) hours as needed for wheezing or shortness of breath.   amLODipine (NORVASC) 10 MG tablet Take by mouth.   [DISCONTINUED] albuterol (VENTOLIN HFA) 108 (90 Base) MCG/ACT inhaler 2 puffs every 4 (four) hours as needed for wheezing or shortness of breath.  (Patient not taking: Reported on 10/31/2023)   [DISCONTINUED] azithromycin (ZITHROMAX) 250 MG tablet Take 1 tablet (250 mg total) by mouth daily. Take first 2 tablets together, then 1 every day until finished. (Patient not taking: Reported on 10/31/2023)   [DISCONTINUED] CHANTIX CONTINUING MONTH PAK 1 MG tablet Take 1 mg by mouth 2 (two) times daily. (Patient not taking: Reported on 10/31/2023)   [  DISCONTINUED] nicotine (NICOTROL) 10 MG inhaler Inhale into the lungs.   [DISCONTINUED] pravastatin (PRAVACHOL) 40 MG tablet Take 40 mg by mouth daily.   [DISCONTINUED] PREVIDENT 5000 SENSITIVE 1.1-5 % PSTE Place 1 application onto teeth 2 (two) times daily. (Patient not taking: Reported on 10/31/2023)   [DISCONTINUED] promethazine-dextromethorphan (PROMETHAZINE-DM) 6.25-15 MG/5ML syrup Take 5 mLs  by mouth 4 (four) times daily as needed. (Patient not taking: Reported on 10/31/2023)   [DISCONTINUED] sildenafil (REVATIO) 20 MG tablet Take 20 mg by mouth daily. Take 5 tablets (100mg  total) once daily as needed. Pulm. hypertension (Patient not taking: Reported on 10/31/2023)   [DISCONTINUED] tamsulosin (FLOMAX) 0.4 MG CAPS capsule Take by mouth. (Patient not taking: Reported on 10/31/2023)   No facility-administered encounter medications on file as of 10/31/2023.    Past Medical History:  Diagnosis Date   BPH (benign prostatic hyperplasia) 2018   COPD (chronic obstructive pulmonary disease) (HCC)    Diastasis recti 2014   Dyslipidemia    GERD (gastroesophageal reflux disease)    Hypertension    Scrotal mass    Left, USS showed 6/16 at [facility], noted also on exam    Past Surgical History:  Procedure Laterality Date   COLONOSCOPY N/A 07/03/2020   Procedure: COLONOSCOPY;  Surgeon: Regis Bill, MD;  Location: [facility] ENDOSCOPY;  Service: Endoscopy;  Laterality: N/A;   COLONOSCOPY WITH PROPOFOL N/A 05/08/2020   Procedure: COLONOSCOPY WITH PROPOFOL;  Surgeon: Regis Bill, MD;  Location: [facility] ENDOSCOPY;  Service: Gastroenterology;  Laterality: N/A;   ESOPHAGOGASTRODUODENOSCOPY (EGD) WITH PROPOFOL N/A 05/08/2020   Procedure: ESOPHAGOGASTRODUODENOSCOPY (EGD) WITH PROPOFOL;  Surgeon: Regis Bill, MD;  Location: [facility] ENDOSCOPY;  Service: Gastroenterology;  Laterality: N/A;   HERNIA REPAIR  2016   umbilical hernia    Family History  Problem Relation Age of Onset   Heart Problems Mother    Cancer Father        in throat, unsure, voice box removed    Social History   Socioeconomic History   Marital status: Single    Spouse name: Not on file   Number of children: 0   Years of education: Not on file   Highest education level: Not on file  Occupational History   Not on file  Tobacco Use   Smoking status: Former    Types: Cigars, Cigarettes   Smokeless tobacco: Never   Vaping Use   Vaping status: Never Used  Substance and Sexual Activity   Alcohol use: Yes    Alcohol/week: 6.0 standard drinks of alcohol    Types: 6 Cans of beer per week    Comment: 2 beers in the evening   Drug use: Never   Sexual activity: Not on file  Other Topics Concern   Not on file  Social History Narrative   Not on file   Social Drivers of Health   Financial Resource Strain: Low Risk  (09/15/2023)   Received from [facility]   Overall Financial Resource Strain (CARDIA)    Difficulty of Paying Living Expenses: Not hard at all  Food Insecurity: No Food Insecurity (10/31/2023)   Hunger Vital Sign    Worried About Running Out of Food in the Last Year: Never true    Ran Out of Food in the Last Year: Never true  Transportation Needs: No Transportation Needs (10/31/2023)   PRAPARE - Administrator, Civil Service (Medical): No    Lack of Transportation (Non-Medical): No  Physical Activity: Not on file  Stress: Not on file  Social Connections: Not on file  Intimate Partner Violence: Not At Risk (10/31/2023)   Humiliation, Afraid, Rape, and Kick questionnaire    Fear of Current or Ex-Partner: No    Emotionally Abused: No    Physically Abused: No    Sexually Abused: No    ROS  Per HPI      Objective    BP 126/89 (BP Location: Right Arm, Patient Position: Sitting, Cuff Size: Normal)   Pulse 90   Temp 98.3 F (36.8 C) (Oral)   Ht 5' 6.73" (1.695 m)   Wt 180 lb 14.4 oz (82.1 kg)   SpO2 95%   BMI 28.56 kg/m   Physical Exam Constitutional:      General: He is not in acute distress.    Appearance: Normal appearance. He is not ill-appearing, toxic-appearing or diaphoretic.  HENT:     Head: Normocephalic and atraumatic.     Right Ear: External ear normal.     Left Ear: External ear normal.     Nose: Nose normal.     Mouth/Throat:     Mouth: Mucous membranes are moist.     Pharynx: Oropharynx is clear.  Eyes:     General: No  scleral icterus.    Extraocular Movements: Extraocular movements intact.     Conjunctiva/sclera: Conjunctivae normal.     Pupils: Pupils are equal, round, and reactive to light.  Neck:     Vascular: No carotid bruit.  Cardiovascular:     Rate and Rhythm: Normal rate and regular rhythm.     Pulses: Normal pulses.     Heart sounds: Normal heart sounds. No murmur heard.    No friction rub.  Pulmonary:     Effort: Pulmonary effort is normal. No respiratory distress.     Breath sounds: No stridor. Wheezing present. No rhonchi or rales.     Comments: Consistent with history of COPD and noncompliance on inhalers. Musculoskeletal:        General: Normal range of motion.     Cervical back: Neck supple.     Right lower leg: No edema.     Left lower leg: No edema.     Comments: Bilateral lower extremities reveal no rubor or cyanosis.  Lymphadenopathy:     Cervical: No cervical adenopathy.  Skin:    General: Skin is warm and dry.  Neurological:     General: No focal deficit present.     Mental Status: He is alert.  Psychiatric:        Mood and Affect: Mood normal.        Behavior: Behavior normal.         Assessment & Plan:   Encounter to establish care  Viral URI with cough Assessment & Plan: Recent sick contact via wife.  No fever or alarm symptoms noted.  Chest x-ray appeared to be clean but awaiting final read.  Ordered benzonatate for symptomatic relief of cough.  Discussed proper use of inhalers and proper technique.  Suspect likely viral URI.  Orders: -     Benzonatate; Take 1 capsule (100 mg total) by mouth 3 (three) times daily as needed for cough.  Dispense: 20 capsule; Refill: 0  Pulmonary emphysema, unspecified emphysema type [facility]) Assessment & Plan: Continue to follow-up with pulmonology.  Discussed proper inhaler technique.  Patient has been noncompliant on medication.  Refill albuterol inhaler and albuterol nebulizer solution.  Patient has not had a rescue  inhaler.  Orders: -  Albuterol Sulfate; Take 3 mLs (2.5 mg total) by nebulization every 6 (six) hours as needed for wheezing or shortness of breath.  Dispense: 75 mL; Refill: 4 -     Albuterol Sulfate HFA; Inhale 2 puffs into the lungs every 6 (six) hours as needed for wheezing or shortness of breath.  Dispense: 6.7 g; Refill: 0 -     DG Chest 2 View -     CBC with Differential/Platelet -     Comprehensive metabolic panel  SOB (shortness of breath) on exertion Assessment & Plan: I believe that this is likely due to his COPD in addition to dealing with a viral URI currently.  Refilled inhaler and albuterol nebulizer as well as discussed proper technique.  Got basic labs.  Wells score of 0 for PE.  No recent leg swelling both bilaterally or unilaterally, no rubor, no cyanosis.  Orders: -     DG Chest 2 View -     CBC with Differential/Platelet -     Comprehensive metabolic panel  Dyslipidemia Assessment & Plan: Will assess lipids today.  Orders: -     Lipid panel  Screening for diabetes mellitus -     Hemoglobin A1c   I have spent greater than 60 minutes charting, educating, diagnosing and managing this patient for this visit.   No follow-ups on file.   Teryl Lucy Deziray Nabi, PA-C

## 2023-10-31 NOTE — Assessment & Plan Note (Signed)
Recent sick contact via wife.  No fever or alarm symptoms noted.  Chest x-ray appeared to be clean but awaiting final read.  Ordered benzonatate for symptomatic relief of cough.  Discussed proper use of inhalers and proper technique.  Suspect likely viral URI.

## 2023-10-31 NOTE — Assessment & Plan Note (Signed)
Continue to follow-up with pulmonology.  Discussed proper inhaler technique.  Patient has been noncompliant on medication.  Refill albuterol inhaler and albuterol nebulizer solution.  Patient has not had a rescue inhaler.

## 2023-10-31 NOTE — Assessment & Plan Note (Signed)
Will assess lipids today.

## 2023-10-31 NOTE — Patient Instructions (Addendum)
It was nice to see you today!  As we discussed in clinic you likely have some sort of viral upper respiratory infection. If you develop a fever, have any symptoms worsen, or any other symptoms come please call the clinic to let me know immediately.   At this point you can use DayQuil during the day to help or NyQuil at night to help you get a sleep.  If your main problem is that you are congested, you may try over-the-counter Sudafed-this should not be used for greater than 5 days in a row.  Your other option would be to continue Mucinex or Mucinex DM if you have a cough.  Any of these medications should help you, but please make sure to read the label to make sure you are not doubling up on any active ingredients listed in them.    I would also encourage you to try an over-the-counter nasal saline spray as this can help with congestion.  If you have any problems before your next visit feel free to message me via MyChart (minor issues or questions) or call the office, otherwise you may reach out to schedule an office visit.  Thank you! Gerilyn Pilgrim Dorrine Montone, PA-C

## 2023-10-31 NOTE — Assessment & Plan Note (Addendum)
I believe that this is likely due to his COPD in addition to dealing with a viral URI currently.  Refilled inhaler and albuterol nebulizer as well as discussed proper technique.  Got basic labs.  Wells score of 0 for PE.  No recent leg swelling both bilaterally or unilaterally, no rubor, no cyanosis.

## 2023-11-01 LAB — COMPREHENSIVE METABOLIC PANEL
ALT: 23 [IU]/L (ref 0–44)
AST: 19 [IU]/L (ref 0–40)
Albumin: 4.5 g/dL (ref 3.8–4.8)
Alkaline Phosphatase: 80 [IU]/L (ref 44–121)
BUN/Creatinine Ratio: 23 (ref 10–24)
BUN: 16 mg/dL (ref 8–27)
Bilirubin Total: 0.2 mg/dL (ref 0.0–1.2)
CO2: 21 mmol/L (ref 20–29)
Calcium: 9.8 mg/dL (ref 8.6–10.2)
Chloride: 96 mmol/L (ref 96–106)
Creatinine, Ser: 0.71 mg/dL — ABNORMAL LOW (ref 0.76–1.27)
Globulin, Total: 2.8 g/dL (ref 1.5–4.5)
Glucose: 104 mg/dL — ABNORMAL HIGH (ref 70–99)
Potassium: 5 mmol/L (ref 3.5–5.2)
Sodium: 137 mmol/L (ref 134–144)
Total Protein: 7.3 g/dL (ref 6.0–8.5)
eGFR: 97 mL/min/{1.73_m2} (ref >59)

## 2023-11-01 LAB — CBC WITH DIFFERENTIAL/PLATELET
Basophils Absolute: 0 10*3/uL (ref 0.0–0.2)
Basos: 1 %
EOS (ABSOLUTE): 0.1 10*3/uL (ref 0.0–0.4)
Eos: 1 %
Hematocrit: 47.3 % (ref 37.5–51.0)
Hemoglobin: 16 g/dL (ref 13.0–17.7)
Immature Grans (Abs): 0 10*3/uL (ref 0.0–0.1)
Immature Granulocytes: 0 %
Lymphocytes Absolute: 1.4 10*3/uL (ref 0.7–3.1)
Lymphs: 23 %
MCH: 30.8 pg (ref 26.6–33.0)
MCHC: 33.8 g/dL (ref 31.5–35.7)
MCV: 91 fL (ref 79–97)
Monocytes Absolute: 0.5 10*3/uL (ref 0.1–0.9)
Monocytes: 8 %
Neutrophils Absolute: 4 10*3/uL (ref 1.4–7.0)
Neutrophils: 67 %
Platelets: 205 10*3/uL (ref 150–450)
RBC: 5.2 x10E6/uL (ref 4.14–5.80)
RDW: 12.3 % (ref 11.6–15.4)
WBC: 6.1 10*3/uL (ref 3.4–10.8)

## 2023-11-01 LAB — LIPID PANEL
Chol/HDL Ratio: 4 {ratio} (ref 0.0–5.0)
Cholesterol, Total: 186 mg/dL (ref 100–199)
HDL: 46 mg/dL (ref >39)
LDL Chol Calc (NIH): 107 mg/dL — ABNORMAL HIGH (ref 0–99)
Triglycerides: 187 mg/dL — ABNORMAL HIGH (ref 0–149)
VLDL Cholesterol Cal: 33 mg/dL (ref 5–40)

## 2023-11-01 LAB — HEMOGLOBIN A1C
Est. average glucose Bld gHb Est-mCnc: 128 mg/dL
Hgb A1c MFr Bld: 6.1 % — ABNORMAL HIGH (ref 4.8–5.6)

## 2023-11-08 ENCOUNTER — Telehealth (HOSPITAL_BASED_OUTPATIENT_CLINIC_OR_DEPARTMENT_OTHER): Payer: Self-pay

## 2023-11-08 DIAGNOSIS — J441 Chronic obstructive pulmonary disease with (acute) exacerbation: Secondary | ICD-10-CM

## 2023-11-08 MED ORDER — PREDNISONE 20 MG PO TABS
40.0000 mg | ORAL_TABLET | Freq: Every day | ORAL | 0 refills | Status: AC
Start: 2023-11-08 — End: 2023-11-13

## 2023-11-08 MED ORDER — AZITHROMYCIN 250 MG PO TABS
ORAL_TABLET | ORAL | 0 refills | Status: AC
Start: 2023-11-08 — End: 2023-11-13

## 2023-11-08 NOTE — Telephone Encounter (Signed)
 Patient called and wanted to get results of chest x-ray

## 2023-11-08 NOTE — Telephone Encounter (Signed)
 Spoke to patient on the phone.  He is still having a productive cough but it is improved, patient reports to be compliant on the Symbicort, albuterol , and albuterol  nebulizer.  Radiology results have not come back for chest x-ray but does not appear to be any pneumonia or major changes to his chest x-ray.  Suspect that this is likely a COPD exacerbation.  I will send in a Z-Pak and some prednisone  at this point for likely COPD exacerbation.

## 2023-11-28 ENCOUNTER — Ambulatory Visit (HOSPITAL_BASED_OUTPATIENT_CLINIC_OR_DEPARTMENT_OTHER): Payer: 59 | Admitting: Student

## 2023-11-28 NOTE — Progress Notes (Deleted)
   Established Patient Office Visit  Subjective   Patient ID: Lucas Schaefer, male    DOB: [birth year]  Age: 73 y.o. MRN: [id number]  No chief complaint on file.   HPI  Hypertension- Pt denies chest pain, SOB, dizziness, or heart palpitations.  Taking meds as directed w/o problems.  Denies medication side effects.    GERD- Patient notes that they have been having reflux for years. *** history of endoscopy.  Patient notes that they are utilizing lifestyle interventions to aid in treatment-plan to provide handout today.  Currently taking *** for reflux symptoms.  Trouble breathing-Notes that he had pneumonia about 2 months ago. Has continued to have trouble breathing with history of the same- followed by pulmonology. Current symptoms started about 4-5 days ago. Wife came back sick (unsure of what she has) and he believes that he got sick from her. Trouble breathing yesterday evening. Mucinex for 4 days. Quit smoking 22 years ago. 35 standard drinks per week per 09/15/23 note. No fever. O2 sat 95%. Coughing is improved, was worse yesterday. Noted that he has had a productive cough. No flu shot. No swelling or rubor in legs. Patient has not been compliant on inhaler medications. Discussed proper technique of inhaler use.   History of COPD and Asthma. History of asbestosis. Pulmonary nodule. Last saw pulmonology on 09/15/23.  {History (Optional):23778}  ROS Negative unless indicated in HPI.    Objective:     There were no vitals taken for this visit. {Vitals History (Optional):23777}  Physical Exam   No results found for any visits on 11/28/23.  {Labs (Optional):23779}  The 10-year ASCVD risk score (Arnett DK, et al., 2019) is: 24.7%    Assessment & Plan:   There are no diagnoses linked to this encounter.   No follow-ups on file.    Teryl Lucy Parker Wherley, PA-C

## 2023-12-05 ENCOUNTER — Ambulatory Visit (HOSPITAL_BASED_OUTPATIENT_CLINIC_OR_DEPARTMENT_OTHER): Payer: 59 | Admitting: Student

## 2023-12-05 NOTE — Progress Notes (Deleted)
   Established Patient Office Visit  Subjective   Patient ID: Lucas Schaefer, male    DOB: [birth year]  Age: 73 y.o. MRN: [id number]  No chief complaint on file.   HPI  Hypertension- Pt denies chest pain, SOB, dizziness, or heart palpitations.  Taking meds as directed w/o problems.  Denies medication side effects.    GERD- Patient notes that they have been having reflux for years. *** history of endoscopy.  Patient notes that they are utilizing lifestyle interventions to aid in treatment-plan to provide handout today.  Currently taking *** for reflux symptoms.  Trouble breathing/COPD-Notes that he had pneumonia about 2 months ago. Has continued to have trouble breathing with history of the same- followed by pulmonology. Current symptoms started about 4-5 days ago. Wife came back sick (unsure of what she has) and he believes that he got sick from her. Trouble breathing yesterday evening. Mucinex for 4 days. Quit smoking 22 years ago. 35 standard drinks per week per 09/15/23 note. No fever. O2 sat 95%. Coughing is improved, was worse yesterday. Noted that he has had a productive cough. No flu shot. No swelling or rubor in legs. Patient has not been compliant on inhaler medications. Discussed proper technique of inhaler use.   History of COPD and Asthma. History of asbestosis. Pulmonary nodule. Last saw pulmonology on 09/15/23.  {History (Optional):23778}  ROS Negative unless indicated in HPI.    Objective:     There were no vitals taken for this visit. {Vitals History (Optional):23777}  Physical Exam   No results found for any visits on 12/05/23.  {Labs (Optional):23779}  The 10-year ASCVD risk score (Arnett DK, et al., 2019) is: 24.7%    Assessment & Plan:   There are no diagnoses linked to this encounter.   No follow-ups on file.    Teryl Lucy Shedric Fredericks, PA-C

## 2024-03-06 ENCOUNTER — Encounter (HOSPITAL_BASED_OUTPATIENT_CLINIC_OR_DEPARTMENT_OTHER): Payer: Self-pay | Admitting: Student

## 2024-03-06 ENCOUNTER — Encounter (HOSPITAL_BASED_OUTPATIENT_CLINIC_OR_DEPARTMENT_OTHER): Payer: Self-pay | Admitting: Emergency Medicine

## 2024-03-06 ENCOUNTER — Other Ambulatory Visit (HOSPITAL_BASED_OUTPATIENT_CLINIC_OR_DEPARTMENT_OTHER): Payer: Self-pay

## 2024-03-06 ENCOUNTER — Encounter (HOSPITAL_BASED_OUTPATIENT_CLINIC_OR_DEPARTMENT_OTHER): Payer: Self-pay

## 2024-03-06 ENCOUNTER — Ambulatory Visit: Payer: Self-pay

## 2024-03-06 ENCOUNTER — Ambulatory Visit (HOSPITAL_BASED_OUTPATIENT_CLINIC_OR_DEPARTMENT_OTHER)
Admission: EM | Admit: 2024-03-06 | Discharge: 2024-03-06 | Disposition: A | Discharge status: Home / Self Care | Attending: Family Medicine | Admitting: Family Medicine

## 2024-03-06 ENCOUNTER — Ambulatory Visit (HOSPITAL_BASED_OUTPATIENT_CLINIC_OR_DEPARTMENT_OTHER): Admitting: Student

## 2024-03-06 DIAGNOSIS — H1031 Unspecified acute conjunctivitis, right eye: Secondary | ICD-10-CM | POA: Diagnosis not present

## 2024-03-06 DIAGNOSIS — H5711 Ocular pain, right eye: Secondary | ICD-10-CM

## 2024-03-06 MED ORDER — MOXIFLOXACIN HCL 0.5 % OP SOLN
1.0000 [drp] | Freq: Three times a day (TID) | OPHTHALMIC | 0 refills | Status: AC
  Filled 2024-03-06: qty 3, 10d supply, fill #0

## 2024-03-06 NOTE — ED Provider Notes (Signed)
 [facility] Scha[facility]    CSN: [id number] Arrival date & time: 03/06/24  1322      History   Chief Complaint No chief complaint on file.   HPI Lucas Schaefer is a 73 y.o. male.   Patient was grinding in joists over his head under a house.  Sawdust and little wood fragments fell in his face and eyes.  The area had been treated with the chemical for termites several weeks prior.  He had some burning in his right eye.  This all happened on 03/04/24.  But its gotten worse as the days have gone by.  Now he has pain and has trouble opening his right eye.  It has been a little matted with discharge today.     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   Scrotal mass 09/22/2017   Benign prostatic hyperplasia with incomplete bladder emptying 08/14/2017   Gastroesophageal reflux disease without esophagitis 08/14/2017   Benign essential HTN 02/03/2016   Chronic obstructive pulmonary disease (HCC) 02/03/2016   Dyslipidemia 02/03/2016   Family history of premature CAD 02/03/2016   SOB (shortness of breath) on exertion 02/03/2016   Diastasis recti 05/09/2013    Past Surgical History:  Procedure Laterality Date   COLONOSCOPY N/A 07/03/2020   Procedure: COLONOSCOPY;  Surgeon: Shane Darling, MD;  Location: [facility] ENDOSCOPY;  Service: Endoscopy;  Laterality: N/A;   COLONOSCOPY WITH PROPOFOL  N/A 05/08/2020   Procedure: COLONOSCOPY WITH PROPOFOL ;  Surgeon: Shane Darling, MD;  Location: [facility] ENDOSCOPY;  Service: Gastroenterology;  Laterality: N/A;    ESOPHAGOGASTRODUODENOSCOPY (EGD) WITH PROPOFOL  N/A 05/08/2020   Procedure: ESOPHAGOGASTRODUODENOSCOPY (EGD) WITH PROPOFOL ;  Surgeon: Shane Darling, MD;  Location: [facility] ENDOSCOPY;  Service: Gastroenterology;  Laterality: N/A;   HERNIA REPAIR  2016   umbilical hernia       Home Medications    Prior to Admission medications   Medication Sig Start Date End Date Taking? Authorizing Provider  amLODipine (NORVASC) 10 MG tablet Take by mouth. 11/29/19 03/06/24 Yes [provider]  budesonide-formoterol (SYMBICORT) 160-4.5 MCG/ACT inhaler Inhale into the lungs. 05/21/18  Yes [provider]  moxifloxacin (VIGAMOX) 0.5 % ophthalmic solution Place 1 drop into both eyes 3 (three) times daily. 03/06/24  Yes Guss Legacy, FNP  omeprazole (PRILOSEC) 40 MG capsule Take 40 mg by mouth daily. 10/09/19  Yes [provider]  pravastatin (PRAVACHOL) 80 MG tablet Take 80 mg by mouth daily.   Yes [provider]  albuterol  (PROVENTIL ) (2.5 MG/3ML) 0.083% nebulizer solution Take 3 mLs (2.5 mg total) by nebulization every 6 (six) hours as needed for wheezing or shortness of breath. 10/31/23   Rothfuss, Jacob T, PA-C  albuterol  (VENTOLIN  HFA) 108 (90 Base) MCG/ACT inhaler Inhale 2 puffs into the lungs every 6 (six) hours as needed for wheezing or shortness of breath. 10/31/23   Rothfuss, Jacob T, PA-C  aspirin EC 81 MG tablet Take by mouth.    [provider]  Buprenorphine HCl-Naloxone HCl 8-2 MG FILM TAKING SMALL  PIECES.    [provider]  cyanocobalamin (VITAMIN B12) 1000 MCG tablet Take 1,000 mcg by mouth daily.    [provider]  lisinopril-hydrochlorothiazide (ZESTORETIC) 20-12.5 MG tablet Take 1 tablet by mouth daily. 07/14/19   [provider]  naloxone [facility]) nasal spray 4 mg/0.1 mL Place 1 spray into the nose as needed. 12/07/23   [provider]  tamsulosin (FLOMAX) 0.4 MG CAPS capsule Take 0.4 mg by mouth daily. 12/07/23    [provider]    Family History Family History  Problem Relation Age of Onset   Heart Problems Mother    Cancer Father        in throat, unsure, voice box removed    Social History Social History   Tobacco Use   Smoking status: Former    Types: Cigars, Cigarettes    Passive exposure: Current   Smokeless tobacco: Never  Vaping Use   Vaping status: Never Used  Substance Use Topics   Alcohol use: Yes    Alcohol/week: 6.0 standard drinks of alcohol    Types: 6 Cans of beer per week    Comment: 2 beers in the evening   Drug use: Never     Allergies   Cyclobenzaprine   Review of Systems Review of Systems  Constitutional:  Negative for fever.  Eyes:  Positive for pain, discharge and redness.  Respiratory:  Negative for cough.   Cardiovascular:  Negative for chest pain.  Gastrointestinal:  Negative for abdominal pain, constipation, diarrhea, nausea and vomiting.  Musculoskeletal:  Negative for arthralgias and back pain.  Skin:  Negative for color change and rash.  Neurological:  Negative for syncope.  All other systems reviewed and are negative.    Physical Exam Triage Vital Signs ED Triage Vitals [03/06/24 1357]  Encounter Vitals Group     BP 136/79     Systolic BP Percentile      Diastolic BP Percentile      Pulse Rate 87     Resp 18     Temp 98.4 F (36.9 C)     Temp Source Oral     SpO2 94 %     Weight      Height      Head Circumference      Peak Flow      Pain Score 0     Pain Loc      Pain Education      Exclude from Growth Chart    No data found.  Updated Vital Signs BP 136/79 (BP Location: Right Arm)   Pulse 87   Temp 98.4 F (36.9 C) (Oral)   Resp 18   SpO2 94%   Visual Acuity Right Eye Distance: 20/30 Left Eye Distance: 20/30 Bilateral Distance: 20/30  Right Eye Near:   Left Eye Near:    Bilateral Near:     Physical Exam Vitals and nursing note reviewed.  Constitutional:      General: He is not in acute  distress.    Appearance: He is well-developed. He is not ill-appearing or toxic-appearing.  HENT:     Head: Normocephalic and atraumatic.     Right Ear: Hearing, tympanic membrane, ear canal and external ear normal.     Left Ear: Hearing, tympanic membrane, ear canal and external ear normal.     Nose: No congestion or rhinorrhea.     Right Sinus: No maxillary sinus tenderness or frontal sinus tenderness.     Left Sinus: No maxillary  sinus tenderness or frontal sinus tenderness.     Mouth/Throat:     Lips: Pink.     Mouth: Mucous membranes are moist.     Pharynx: Uvula midline. No oropharyngeal exudate or posterior oropharyngeal erythema.     Tonsils: No tonsillar exudate.  Eyes:     General: Lids are normal. No visual field deficit.       Right eye: Discharge present. No foreign body or hordeolum.        Left eye: No foreign body, discharge or hordeolum.     Conjunctiva/sclera: Conjunctivae normal.     Right eye: Right conjunctiva is not injected. Chemosis and exudate present. No hemorrhage.    Left eye: Left conjunctiva is not injected. No chemosis, exudate or hemorrhage.    Pupils: Pupils are equal, round, and reactive to light.     Right eye: Pupil is round, reactive and not sluggish. No corneal abrasion or fluorescein uptake. Seidel exam negative.  Cardiovascular:     Rate and Rhythm: Normal rate and regular rhythm.     Heart sounds: S1 normal and S2 normal. No murmur heard. Pulmonary:     Effort: Pulmonary effort is normal. No respiratory distress.     Breath sounds: Normal breath sounds. No decreased breath sounds, wheezing, rhonchi or rales.  Abdominal:     General: Bowel sounds are normal.     Palpations: Abdomen is soft.     Tenderness: There is no abdominal tenderness.  Musculoskeletal:        General: No swelling.     Cervical back: Neck supple.  Lymphadenopathy:     Head:     Right side of head: No submental, submandibular, tonsillar, preauricular or posterior  auricular adenopathy.     Left side of head: No submental, submandibular, tonsillar, preauricular or posterior auricular adenopathy.     Cervical: No cervical adenopathy.     Right cervical: No superficial cervical adenopathy.    Left cervical: No superficial cervical adenopathy.  Skin:    General: Skin is warm and dry.     Capillary Refill: Capillary refill takes less than 2 seconds.     Findings: No rash.  Neurological:     Mental Status: He is alert and oriented to person, place, and time.  Psychiatric:        Mood and Affect: Mood normal.      UC Treatments / Results  Labs (all labs ordered are listed, but only abnormal results are displayed) Labs Reviewed - No data to display  EKG   Radiology No results found.  Procedures Procedures (including critical care time)  Medications Ordered in UC Medications - No data to display  Initial Impression / Assessment and Plan / UC Course  I have reviewed the triage vital signs and the nursing notes.  Pertinent labs & imaging results that were available during my care of the patient were reviewed by me and considered in my medical decision making (see chart for details).  Plan of Care: Conjunctivitis and right eye pain: Moxifloxacin ophthalmic drops, 1 drop into right eye 3 times daily for 5 to 7 days.  Wash with just warm water.  Follow-up here or with ophthalmology if symptoms do not improve, worsen or new symptoms occur.  I reviewed the plan of care with the patient and/or the patient's guardian.  The patient and/or guardian had time to ask questions and acknowledged that the questions were answered.  I provided instruction on symptoms or reasons to return here  or to go to an ER, if symptoms/condition did not improve, worsened or if new symptoms occurred.  Final Clinical Impressions(s) / UC Diagnoses   Final diagnoses:  Acute conjunctivitis of right eye, unspecified acute conjunctivitis type  Acute right eye pain      Discharge Instructions      Conjunctivitis and right eye pain: Exam was negative for corneal abrasion.  Will treat for conjunctivitis with moxifloxacin ophthalmic drops, 1 drop 3 times daily for 5 to 7 days.  Wash eye with warm water daily.  Follow-up here or with ophthalmology if symptoms do not improve, worsen or new symptoms occur.   ED Prescriptions     Medication Sig Dispense Auth. Provider   moxifloxacin (VIGAMOX) 0.5 % ophthalmic solution Place 1 drop into both eyes 3 (three) times daily. 3 mL Guss Legacy, FNP      PDMP not reviewed this encounter.   Guss Legacy, FNP 03/06/24 1446

## 2024-03-06 NOTE — Discharge Instructions (Signed)
 Conjunctivitis and right eye pain: Exam was negative for corneal abrasion.  Will treat for conjunctivitis with moxifloxacin ophthalmic drops, 1 drop 3 times daily for 5 to 7 days.  Wash eye with warm water daily.  Follow-up here or with ophthalmology if symptoms do not improve, worsen or new symptoms occur.

## 2024-03-06 NOTE — ED Triage Notes (Signed)
 Pt was grinding above his head and wood pieces came down and got in his eye happened on Monday. Pt right eye is irritated and red.

## 2024-03-06 NOTE — Telephone Encounter (Signed)
  FYI Only or Action Required?: FYI only for provider  Patient was last seen in primary care on 10/31/23. Called Nurse Triage reporting Eye Problem. Symptoms began several days ago. Interventions attempted: OTC medications: OTC eye wash. Symptoms are: unchanged.  Triage Disposition: See Physician Within 24 Hours  Patient/caregiver understands and will follow disposition?: Yes                             Copied from CRM [id number]. Topic: Clinical - Red Word Triage >> Mar 06, 2024  8:56 AM Zipporah Him wrote: Red Word that prompted transfer to Nurse Triage: Right eye is red and running, its swollen, onset Monday afternoon. Eye wash has not helped. Large and looks infected. Reason for Disposition  MODERATE-SEVERE eyelid swelling on one side  (Exception: Due to a mosquito bite.)  Answer Assessment - Initial Assessment Questions 1. ONSET: "When did the swelling start?" (e.g., minutes, hours, days)     Monday 2. LOCATION: "What part of the eyelids is swollen?"     Right eye 3. SEVERITY: "How swollen is it?"     States patient is still able to open the eye, but underneath is very swollen, states eye is hard to open 4. ITCHING: "Is there any itching?" If Yes, ask: "How much?"   (Scale 1-10; mild, moderate or severe)     Denies  5. PAIN: "Is the swelling painful to touch?" If Yes, ask: "How painful is it?"   (Scale 1-10; mild, moderate or severe)     Denies pain, just swelling and redness 6. FEVER: "Do you have a fever?" If Yes, ask: "What is it, how was it measured, and when did it start?"      Denies  7. CAUSE: "What do you think is causing the swelling?"     Unsure 8. RECURRENT SYMPTOM: "Have you had eyelid swelling before?" If Yes, ask: "When was the last time?" "What happened that time?"     Denies 9. OTHER SYMPTOMS: "Do you have any other symptoms?" (e.g., blurred vision, eye discharge, rash, runny nose)     OTC eye wash is not helping, states eye is very watery when  he opens eye, yellow discharge after using eye wash, denies cold/flu symptoms  Protocols used: Eye - Swelling-A-AH

## 2024-03-07 NOTE — Progress Notes (Signed)
 error

## 2024-06-22 ENCOUNTER — Other Ambulatory Visit: Payer: Self-pay

## 2024-06-22 ENCOUNTER — Emergency Department

## 2024-06-22 ENCOUNTER — Emergency Department: Admission: EM | Admit: 2024-06-22 | Discharge: 2024-06-22 | Disposition: A | Discharge status: Home / Self Care

## 2024-06-22 DIAGNOSIS — J449 Chronic obstructive pulmonary disease, unspecified: Secondary | ICD-10-CM | POA: Diagnosis not present

## 2024-06-22 DIAGNOSIS — R000 Tachycardia, unspecified: Secondary | ICD-10-CM | POA: Diagnosis present

## 2024-06-22 DIAGNOSIS — I10 Essential (primary) hypertension: Secondary | ICD-10-CM | POA: Insufficient documentation

## 2024-06-22 DIAGNOSIS — I4891 Unspecified atrial fibrillation: Secondary | ICD-10-CM | POA: Diagnosis not present

## 2024-06-22 DIAGNOSIS — R0602 Shortness of breath: Secondary | ICD-10-CM | POA: Diagnosis not present

## 2024-06-22 LAB — CBC
HCT: 43.4 % (ref 39.0–52.0)
Hemoglobin: 15 g/dL (ref 13.0–17.0)
MCH: 30.4 pg (ref 26.0–34.0)
MCHC: 34.6 g/dL (ref 30.0–36.0)
MCV: 88 fL (ref 80.0–100.0)
Platelets: 263 K/uL (ref 150–400)
RBC: 4.93 MIL/uL (ref 4.22–5.81)
RDW: 12.8 % (ref 11.5–15.5)
WBC: 9.2 K/uL (ref 4.0–10.5)
nRBC: 0 % (ref 0.0–0.2)

## 2024-06-22 LAB — BASIC METABOLIC PANEL WITH GFR
Anion gap: 11 (ref 5–15)
BUN: 30 mg/dL — ABNORMAL HIGH (ref 8–23)
CO2: 23 mmol/L (ref 22–32)
Calcium: 8.7 mg/dL — ABNORMAL LOW (ref 8.9–10.3)
Chloride: 98 mmol/L (ref 98–111)
Creatinine, Ser: 0.93 mg/dL (ref 0.61–1.24)
GFR, Estimated: >60 mL/min (ref >60)
Glucose, Bld: 146 mg/dL — ABNORMAL HIGH (ref 70–99)
Potassium: 3.8 mmol/L (ref 3.5–5.1)
Sodium: 132 mmol/L — ABNORMAL LOW (ref 135–145)

## 2024-06-22 LAB — BRAIN NATRIURETIC PEPTIDE: B Natriuretic Peptide: 369.6 pg/mL — ABNORMAL HIGH (ref 0.0–100.0)

## 2024-06-22 LAB — TROPONIN I (HIGH SENSITIVITY)
Troponin I (High Sensitivity): 3 ng/L (ref <18)
Troponin I (High Sensitivity): 3 ng/L (ref <18)

## 2024-06-22 MED ORDER — METOPROLOL TARTRATE 25 MG PO TABS
25.0000 mg | ORAL_TABLET | Freq: Two times a day (BID) | ORAL | 2 refills | Status: DC
  Filled 2024-06-22: qty 60, 30d supply, fill #0

## 2024-06-22 MED ORDER — METOPROLOL TARTRATE 25 MG PO TABS: 25.0000 mg | ORAL_TABLET | Freq: Two times a day (BID) | ORAL | 2 refills | Status: AC

## 2024-06-22 MED ORDER — METOPROLOL TARTRATE 5 MG/5ML IV SOLN
5.0000 mg | Freq: Once | INTRAVENOUS | Status: AC
  Administered 2024-06-22: 5 mg via INTRAVENOUS
  Filled 2024-06-22: qty 5

## 2024-06-22 MED ORDER — SODIUM CHLORIDE 0.9 % IV BOLUS
500.0000 mL | Freq: Once | INTRAVENOUS | Status: AC
  Administered 2024-06-22: 500 mL via INTRAVENOUS

## 2024-06-22 MED ORDER — APIXABAN 5 MG PO TABS: 5.0000 mg | ORAL_TABLET | Freq: Two times a day (BID) | ORAL | 2 refills | Status: AC

## 2024-06-22 MED ORDER — APIXABAN 5 MG PO TABS
5.0000 mg | ORAL_TABLET | Freq: Two times a day (BID) | ORAL | 2 refills | Status: DC
  Filled 2024-06-22: qty 60, 30d supply, fill #0

## 2024-06-22 MED ORDER — METOPROLOL TARTRATE 25 MG PO TABS
25.0000 mg | ORAL_TABLET | Freq: Once | ORAL | Status: AC
  Administered 2024-06-22: 25 mg via ORAL
  Filled 2024-06-22: qty 1

## 2024-06-22 NOTE — Discharge Instructions (Addendum)
 Your evaluation in the emergency department was notable for an abnormal heart rhythm called atrial fibrillation.  As discussed, I have started you on a medication to be control your heart rate, and in shared decision making we have also started you on a blood thinner.  I placed a referral for you to follow-up with a cardiologist--they will contact you to schedule appointment.  Please also follow-up closely with your primary care provider.  Return to the emergency department with any new or worsening symptoms.

## 2024-06-22 NOTE — ED Triage Notes (Signed)
 Pt to ED for possible a fib. His neighbor has a device to check heart rhythm like a credit card, I put my finger on it about 1 hour ago and it was a fib with HR 121. Pt states he has felt fatigued and SOB for about 6-12 months. Denies dizziness. Skin is dry.

## 2024-06-22 NOTE — ED Provider Notes (Signed)
 [facility] Provider Note    Event Date/Time   First MD Initiated Contact with Patient 06/22/24 1502     (approximate)   History   Atrial Fibrillation and Fatigue  Pt to ED for possible a fib. His neighbor has a device to check heart rhythm like a credit card, I put my finger on it about 1 hour ago and it was a fib with HR 121. Pt states he has felt fatigued and SOB for about 6-12 months. Denies dizziness. Skin is dry.   HPI Lucas Schaefer is a 73 y.o. male PMH COPD, hypertension, GERD presents for evaluation of prior tachycardia with concern for arrhythmia -Patient states he has been in his usual state of health, his neighbor got a new device to check heart rate and wanted him over to draw it.  Patient put his finger on the device and I told him his heart rate was in the 120s and that he was in A-fib so his neighbors told him to come to the hospital for evaluation. - On my evaluation, patient denies any complaints.  Says he has been in his usual state of health.  No cough, shortness of breath, chest pain, abdominal pain, nausea/vomiting/diarrhea.  No leg swelling.  No history of DVT/PE, no recent surgery/recent/travel, no leg pain, no pleuritic discomfort.       Physical Exam   Triage Vital Signs: ED Triage Vitals  Encounter Vitals Group     BP 06/22/24 1351 (!) 149/105     Girls Systolic BP Percentile --      Girls Diastolic BP Percentile --      Boys Systolic BP Percentile --      Boys Diastolic BP Percentile --      Pulse Rate 06/22/24 1351 (!) 127     Resp 06/22/24 1351 20     Temp 06/22/24 1351 98 F (36.7 C)     Temp Source 06/22/24 1351 Oral     SpO2 06/22/24 1351 98 %     Weight 06/22/24 1349 184 lb (83.5 kg)     Height 06/22/24 1349 5' 7 (1.702 m)     Head Circumference --      Peak Flow --      Pain Score 06/22/24 1344 0     Pain Loc --      Pain Education --      Exclude from Growth Chart --     Most recent vital  signs: Vitals:   06/22/24 1730 06/22/24 1800  BP: 139/75 (!) 148/93  Pulse: 92 73  Resp: 17 13  Temp:    SpO2: 97% 98%     General: Awake, no distress.  CV:  Good peripheral perfusion.  Mild tachycardia, irregular rhythm, RP 2+ Resp:  Normal effort. CTAB Abd:  No distention. Nontender to deep palpation throughout Other:  No lower extremity edema appreciated   ED Results / Procedures / Treatments   Labs (all labs ordered are listed, but only abnormal results are displayed) Labs Reviewed  BASIC METABOLIC PANEL WITH GFR - Abnormal; Notable for the following components:      Result Value   Sodium 132 (*)    Glucose, Bld 146 (*)    BUN 30 (*)    Calcium 8.7 (*)    All other components within normal limits  BRAIN NATRIURETIC PEPTIDE - Abnormal; Notable for the following components:   B Natriuretic Peptide 369.6 (*)    All other components within normal  limits  CBC  TROPONIN I (HIGH SENSITIVITY)  TROPONIN I (HIGH SENSITIVITY)     EKG  See ED course below   RADIOLOGY Radiology interpreted by myself and radiology report reviewed.  No acute pathology identified.    PROCEDURES:  Critical Care performed: No  Procedures   MEDICATIONS ORDERED IN ED: Medications  sodium chloride  0.9 % bolus 500 mL (0 mLs Intravenous Stopped 06/22/24 1639)  metoprolol  tartrate (LOPRESSOR ) injection 5 mg (5 mg Intravenous Given 06/22/24 1755)  metoprolol  tartrate (LOPRESSOR ) tablet 25 mg (25 mg Oral Given 06/22/24 1806)     IMPRESSION / MDM / ASSESSMENT AND PLAN / ED COURSE  I reviewed the triage vital signs and the nursing notes.                              DDX/MDM/AP: Differential diagnosis includes, but is not limited to, cardiac arrhythmia, consider underlying dehydration, anemia, electrolyte abnormality.  Doubt ACS heart failure.  Do not clinically suspect PE at this time given patient is completely asymptomatic.  Plan: -Labs -Chest x-ray EKG Cardiac monitor  Patient's  presentation is most consistent with acute presentation with potential threat to life or bodily function.  The patient is on the cardiac monitor to evaluate for evidence of arrhythmia and/or significant heart rate changes.  ED course below.  Workup suggestive of mildly dehydration, given 500 cc bolus IV fluid.  Initial EKG with undetermined rhythm, I do see distinct P waves and suspect patient may be having frequent PACs.  Repeat EKG does show A-fib.  Did have brief RVR that was controlled with single dose of IV metoprolol , loaded with p.o.  CHA2DS2-VASc 2, and shared decision making patient would like to initiate anticoagulation, started on Eliquis .  Prefer discharge home as opposed to admission which I believe is reasonable, cardiology referral placed, also plan for PMD follow-up.  Strict ED return precautions in place.  Patient agrees with plan.  Clinical Course as of 06/22/24 1818  Sat Jun 22, 2024  1541 BMP with mild hyponatremia, BUN mildly elevated--consider mild dehydration  CBC reviewed, unremarkable  Troponin normal [MM]  1542 Chest x-ray interpreted by myself, no acute findings  CXR: IMPRESSION: 1. No acute findings. 2. 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 3. Bibasilar scarring. 4. Stable blunting of the right costophrenic angle. Corresponding to pleural thickening as noted on CT from 08/23/2023 5. Remote right rib fractures.   [MM]  1543 Ecg = narrow complex tachycardia, rate 127, no gross ST elevation or depression, no significant repolarization abnormality, left axis deviation.  Possible intermittent A-fib versus frequent PACs [MM]  1702 Repeat Trop stable [MM]  1730 Repeat EKG does appear consistent with A-fib  Will discuss potential anticoagulation with patient [MM]  1749 Reevaluated, has become tachycardic to the 1 teens when standing the drops back to about 100 when he sits back down.  Will give small dose of IV metoprolol  here and load orally if he  tolerates this well.  Discussed possible anticoagulation, risks and benefits discussed in detail, patient is amenable to initiating anticoagulation.  Offered admission for further evaluation given he is in A-fib RVR here.  He prefers discharge home if all possible.  If able to control rate we will proceed with discharge home with plan for outpatient cardiology and PMD close follow-up. [MM]  1802 Patient responded well to 5 mg IV metoprolol .  Plan for p.o. load [MM]    Clinical Course  User Index [MM] Clarine Ozell LABOR, MD     FINAL CLINICAL IMPRESSION(S) / ED DIAGNOSES   Final diagnoses:  Atrial fibrillation, unspecified type [facility])     Rx / DC Orders   ED Discharge Orders          Ordered    Ambulatory referral to Cardiology       Comments: New onset A-fib   06/22/24 1812    metoprolol  tartrate (LOPRESSOR ) 25 MG tablet  2 times daily,   Status:  Discontinued        06/22/24 1813    apixaban  (ELIQUIS ) 5 MG TABS tablet  2 times daily,   Status:  Discontinued        06/22/24 1814    apixaban  (ELIQUIS ) 5 MG TABS tablet  2 times daily        06/22/24 1817    metoprolol  tartrate (LOPRESSOR ) 25 MG tablet  2 times daily        06/22/24 1817             Note:  This document was prepared using Dragon voice recognition software and may include unintentional dictation errors.   Clarine Ozell LABOR, MD 06/22/24 [id number]

## 2024-06-24 ENCOUNTER — Other Ambulatory Visit (HOSPITAL_BASED_OUTPATIENT_CLINIC_OR_DEPARTMENT_OTHER): Payer: Self-pay

## 2024-09-02 ENCOUNTER — Other Ambulatory Visit: Payer: Self-pay | Admitting: Specialist

## 2024-09-02 DIAGNOSIS — Z8709 Personal history of other diseases of the respiratory system: Secondary | ICD-10-CM

## 2024-09-02 DIAGNOSIS — R053 Chronic cough: Secondary | ICD-10-CM

## 2024-09-02 DIAGNOSIS — R0602 Shortness of breath: Secondary | ICD-10-CM

## 2024-09-10 ENCOUNTER — Ambulatory Visit: Admission: RE | Admit: 2024-09-10 | Discharge: 2024-09-10 | Attending: Specialist | Admitting: Specialist

## 2024-09-10 DIAGNOSIS — Z8709 Personal history of other diseases of the respiratory system: Secondary | ICD-10-CM

## 2024-09-10 DIAGNOSIS — R053 Chronic cough: Secondary | ICD-10-CM

## 2024-09-10 DIAGNOSIS — R0602 Shortness of breath: Secondary | ICD-10-CM

## 2024-09-23 ENCOUNTER — Other Ambulatory Visit

## 2024-11-01 ENCOUNTER — Encounter
Admission: RE | Disposition: A | Discharge status: Home / Self Care | Payer: Self-pay | Source: Home / Self Care | Attending: Internal Medicine

## 2024-11-01 ENCOUNTER — Ambulatory Visit

## 2024-11-01 ENCOUNTER — Ambulatory Visit
Admission: RE | Admit: 2024-11-01 | Discharge: 2024-11-01 | Disposition: A | Discharge status: Home / Self Care | Attending: Internal Medicine | Admitting: Internal Medicine

## 2024-11-01 DIAGNOSIS — I10 Essential (primary) hypertension: Secondary | ICD-10-CM | POA: Insufficient documentation

## 2024-11-01 DIAGNOSIS — I4891 Unspecified atrial fibrillation: Secondary | ICD-10-CM | POA: Insufficient documentation

## 2024-11-01 DIAGNOSIS — Z87891 Personal history of nicotine dependence: Secondary | ICD-10-CM | POA: Diagnosis not present

## 2024-11-01 DIAGNOSIS — Z538 Procedure and treatment not carried out for other reasons: Secondary | ICD-10-CM | POA: Diagnosis not present

## 2024-11-01 DIAGNOSIS — I480 Paroxysmal atrial fibrillation: Secondary | ICD-10-CM | POA: Diagnosis present

## 2024-11-01 NOTE — Anesthesia Preprocedure Evaluation (Signed)
"                                    Anesthesia Evaluation  Patient identified by MRN, date of birth, ID band Patient awake    Reviewed: Allergy & Precautions, H&P , NPO status , Patient's Chart, lab work & pertinent test results  Airway Mallampati: II  TM Distance: >3 FB Neck ROM: Full    Dental no notable dental hx.    Pulmonary neg pulmonary ROS, former smoker   Pulmonary exam normal breath sounds clear to auscultation       Cardiovascular hypertension, negative cardio ROS Normal cardiovascular exam Rhythm:Regular Rate:Normal     Neuro/Psych negative neurological ROS  negative psych ROS   GI/Hepatic negative GI ROS, Neg liver ROS,,,  Endo/Other  negative endocrine ROS    Renal/GU negative Renal ROS  negative genitourinary   Musculoskeletal negative musculoskeletal ROS (+)    Abdominal   Peds negative pediatric ROS (+)  Hematology negative hematology ROS (+)   Anesthesia Other Findings   Reproductive/Obstetrics negative OB ROS                              Anesthesia Physical Anesthesia Plan  ASA: 3  Anesthesia Plan: MAC   Post-op Pain Management:    Induction: Intravenous  PONV Risk Score and Plan:   Airway Management Planned:   Additional Equipment:   Intra-op Plan:   Post-operative Plan: Extubation in OR  Informed Consent: I have reviewed the patients History and Physical, chart, labs and discussed the procedure including the risks, benefits and alternatives for the proposed anesthesia with the patient or authorized representative who has indicated his/her understanding and acceptance.     Dental advisory given  Plan Discussed with: CRNA  Anesthesia Plan Comments:         Anesthesia Quick Evaluation  "

## 2024-11-04 ENCOUNTER — Encounter: Payer: Self-pay | Admitting: Internal Medicine
